# Patient Record
Sex: Female | Born: 1960 | Race: White | Hispanic: No | Marital: Married | Smoking: Former smoker
Health system: Southern US, Community
[De-identification: ages and names within clinical notes are randomized; demographics above are authoritative.]

## PROBLEM LIST (undated history)

## (undated) DIAGNOSIS — M5136 Other intervertebral disc degeneration, lumbar region: Secondary | ICD-10-CM

## (undated) DIAGNOSIS — E7211 Homocystinuria: Secondary | ICD-10-CM

## (undated) DIAGNOSIS — B009 Herpesviral infection, unspecified: Secondary | ICD-10-CM

## (undated) DIAGNOSIS — K219 Gastro-esophageal reflux disease without esophagitis: Secondary | ICD-10-CM

## (undated) DIAGNOSIS — G43909 Migraine, unspecified, not intractable, without status migrainosus: Secondary | ICD-10-CM

## (undated) DIAGNOSIS — I10 Essential (primary) hypertension: Secondary | ICD-10-CM

## (undated) DIAGNOSIS — G459 Transient cerebral ischemic attack, unspecified: Secondary | ICD-10-CM

## (undated) HISTORY — DX: Herpesviral infection, unspecified: B00.9

## (undated) HISTORY — DX: Migraine, unspecified, not intractable, without status migrainosus: G43.909

## (undated) HISTORY — DX: Essential (primary) hypertension: I10

## (undated) HISTORY — PX: TONSILLECTOMY: SUR1361

## (undated) HISTORY — DX: Other intervertebral disc degeneration, lumbar region: M51.36

## (undated) HISTORY — DX: Homocystinuria: E72.11

## (undated) HISTORY — DX: Transient cerebral ischemic attack, unspecified: G45.9

## (undated) HISTORY — PX: OTHER SURGICAL HISTORY: SHX169

## (undated) HISTORY — PX: ABDOMINAL HYSTERECTOMY: SHX81

---

## 1999-04-04 ENCOUNTER — Other Ambulatory Visit: Admission: RE | Admit: 1999-04-04 | Discharge: 1999-04-04 | Payer: Self-pay | Admitting: Obstetrics and Gynecology

## 2000-04-30 ENCOUNTER — Other Ambulatory Visit: Admission: RE | Admit: 2000-04-30 | Discharge: 2000-04-30 | Payer: Self-pay | Admitting: Obstetrics and Gynecology

## 2001-05-06 ENCOUNTER — Other Ambulatory Visit: Admission: RE | Admit: 2001-05-06 | Discharge: 2001-05-06 | Payer: Self-pay | Admitting: Obstetrics and Gynecology

## 2001-07-21 ENCOUNTER — Encounter: Payer: Self-pay | Admitting: Obstetrics and Gynecology

## 2001-07-21 ENCOUNTER — Encounter: Admission: RE | Admit: 2001-07-21 | Discharge: 2001-07-21 | Payer: Self-pay | Admitting: Obstetrics and Gynecology

## 2002-05-06 ENCOUNTER — Encounter: Payer: Self-pay | Admitting: Emergency Medicine

## 2002-05-06 ENCOUNTER — Emergency Department (HOSPITAL_COMMUNITY): Admission: EM | Admit: 2002-05-06 | Discharge: 2002-05-06 | Payer: Self-pay | Admitting: Emergency Medicine

## 2002-05-06 ENCOUNTER — Inpatient Hospital Stay (HOSPITAL_COMMUNITY): Admission: EM | Admit: 2002-05-06 | Discharge: 2002-05-08 | Payer: Self-pay | Admitting: Neurology

## 2002-05-07 ENCOUNTER — Encounter: Payer: Self-pay | Admitting: Neurology

## 2002-05-07 ENCOUNTER — Encounter (INDEPENDENT_AMBULATORY_CARE_PROVIDER_SITE_OTHER): Payer: Self-pay | Admitting: Cardiology

## 2002-05-08 DIAGNOSIS — G459 Transient cerebral ischemic attack, unspecified: Secondary | ICD-10-CM

## 2002-05-08 HISTORY — DX: Transient cerebral ischemic attack, unspecified: G45.9

## 2003-01-15 ENCOUNTER — Encounter: Payer: Self-pay | Admitting: Obstetrics and Gynecology

## 2003-01-15 ENCOUNTER — Encounter: Admission: RE | Admit: 2003-01-15 | Discharge: 2003-01-15 | Payer: Self-pay | Admitting: Obstetrics and Gynecology

## 2003-11-02 ENCOUNTER — Encounter: Admission: RE | Admit: 2003-11-02 | Discharge: 2003-11-02 | Payer: Self-pay | Admitting: Family Medicine

## 2005-08-03 ENCOUNTER — Encounter: Admission: RE | Admit: 2005-08-03 | Discharge: 2005-08-03 | Payer: Self-pay | Admitting: Obstetrics & Gynecology

## 2006-03-04 ENCOUNTER — Encounter: Admission: RE | Admit: 2006-03-04 | Discharge: 2006-03-04 | Payer: Self-pay | Admitting: Family Medicine

## 2007-07-12 ENCOUNTER — Emergency Department (HOSPITAL_COMMUNITY): Admission: EM | Admit: 2007-07-12 | Discharge: 2007-07-12 | Payer: Self-pay | Admitting: Emergency Medicine

## 2007-09-25 ENCOUNTER — Encounter: Admission: RE | Admit: 2007-09-25 | Discharge: 2007-09-25 | Payer: Self-pay | Admitting: Family Medicine

## 2007-09-29 ENCOUNTER — Ambulatory Visit: Payer: Self-pay | Admitting: Vascular Surgery

## 2007-10-30 ENCOUNTER — Ambulatory Visit: Payer: Self-pay | Admitting: Vascular Surgery

## 2007-11-13 ENCOUNTER — Ambulatory Visit: Payer: Self-pay | Admitting: Vascular Surgery

## 2009-02-22 ENCOUNTER — Encounter: Admission: RE | Admit: 2009-02-22 | Discharge: 2009-02-22 | Payer: Self-pay | Admitting: Obstetrics & Gynecology

## 2009-02-25 ENCOUNTER — Encounter: Admission: RE | Admit: 2009-02-25 | Discharge: 2009-02-25 | Payer: Self-pay | Admitting: Obstetrics & Gynecology

## 2010-07-31 ENCOUNTER — Encounter: Payer: Self-pay | Admitting: Obstetrics & Gynecology

## 2010-09-06 ENCOUNTER — Other Ambulatory Visit: Payer: Self-pay | Admitting: Obstetrics & Gynecology

## 2010-09-06 DIAGNOSIS — Z1231 Encounter for screening mammogram for malignant neoplasm of breast: Secondary | ICD-10-CM

## 2010-09-13 ENCOUNTER — Ambulatory Visit
Admission: RE | Admit: 2010-09-13 | Discharge: 2010-09-13 | Disposition: A | Discharge status: Home / Self Care | Payer: BC Managed Care – PPO | Source: Ambulatory Visit | Attending: Obstetrics & Gynecology | Admitting: Obstetrics & Gynecology

## 2010-09-13 DIAGNOSIS — Z1231 Encounter for screening mammogram for malignant neoplasm of breast: Secondary | ICD-10-CM

## 2010-09-15 ENCOUNTER — Other Ambulatory Visit: Payer: Self-pay | Admitting: Obstetrics & Gynecology

## 2010-09-15 DIAGNOSIS — R928 Other abnormal and inconclusive findings on diagnostic imaging of breast: Secondary | ICD-10-CM

## 2010-09-20 ENCOUNTER — Ambulatory Visit
Admission: RE | Admit: 2010-09-20 | Discharge: 2010-09-20 | Disposition: A | Discharge status: Home / Self Care | Payer: BC Managed Care – PPO | Source: Ambulatory Visit | Attending: Obstetrics & Gynecology | Admitting: Obstetrics & Gynecology

## 2010-09-20 DIAGNOSIS — R928 Other abnormal and inconclusive findings on diagnostic imaging of breast: Secondary | ICD-10-CM

## 2010-09-25 ENCOUNTER — Other Ambulatory Visit: Payer: BC Managed Care – PPO

## 2010-11-21 NOTE — Consult Note (Signed)
VASCULAR SURGERY CONSULTATION   Autumn Coleman, Autumn Coleman  DOB:  1961-06-15                                       09/29/2007  EAVWU#:[id number]   The patient is a 50 year old healthy female who is referred through the  courtesy of Dr. Clarene Duke for evaluation of venous insufficiency.  She has  noted permanent spider veins and reticular veins in her lower thigh and  particularly posteriorly in the distal thigh and proximal calf over the  last year.  She does not complain of pain or discomfort associated with  these, but they have become more prominent in appearance.  She denies  any history of deep vein thrombosis, thrombophlebitis, pulmonary emboli,  distal swelling, venous stasis ulcers, or bleeding associated with her  venous disease.  She has not worn elastic compression stockings and does  not require pain medication or elevation of the legs.   PAST MEDICAL HISTORY:  Negative for diabetes, hypertension, coronary  artery disease, chronic obstructive pulmonary disease, or stroke.  She  does have gastroesophageal reflux disease, which is mild.   PREVIOUS SURGERIES:  1. Hysterectomy.  2. Tonsillectomy.   FAMILY HISTORY:  Negative for coronary artery disease, diabetes, and  stroke.   SOCIAL HISTORY:  She is married and has 1 child.  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  She smokes about  1/2 pack of cigarettes per day, but discontinued this about 4 weeks ago.  Has done so for 25 years.  Does not use alcohol.   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  Vision returned within 24 hours.  She has occasional headaches.   ALLERGIES:  Aspirin, ibuprofen, which cause anaphylactic reactions, and  sulfa.   PHYSICAL EXAM:  Blood pressure is 136/88, heart rate 74, respirations  14.  In general, she is a healthy-appearing middle-aged female  in no  apparent distress.  She is alert and oriented x3.  Neck is supple with  3+ carotid pulses palpable.  No bruits audible.  No palpable adenopathy  in the neck.  Her neurologic exam is normal.  Upper extremity pulses 3+  bilaterally.  Chest is clear to auscultation.  Cardiovascular exam is  regular rhythm without murmurs.  Abdomen is soft and nontender with no  palpable masses.  3+ femoral, popliteal, and dorsalis pedis pulses  palpable.  Lower extremity exam reveals no edema or evidence of  ischemia.  No prominent varicosities are noted.  She does have some  prominent reticular veins and spider veins, particularly in the lower  thigh and proximal calf, around the popliteal fossa bilaterally, in the  posterior area.  There are also some spider veins in the distal thigh  medially, particularly on the left.  No hyperpigmentation or ulcerations  noted.   She has primary telangiectasias and reticular veins in the legs, which  are not causing significant symptoms, but are cosmetic concern to her.  We will discuss possible sclerotherapy for these lesions with her, and  if she would like to proceed, we will schedule that in the near future.   Quita Skye Hart Rochester, M.D.  Electronically Signed  JDL/MEDQ  D:  09/29/2007  T:  09/29/2007  Job:  [id number]  cc:   Caryn Bee L. Little, M.D.

## 2010-11-24 NOTE — Discharge Summary (Signed)
NAME:  Autumn, Coleman                           ACCOUNT NO.:  [id number]   MEDICAL RECORD NO.:  [id number]                   PATIENT TYPE:  INP   LOCATION:  3030                                 FACILITY:  [facility]   PHYSICIAN:  Genene Churn. Love, M.D.                 DATE OF BIRTH:  June 29, 1961   DATE OF ADMISSION:  05/06/2002  DATE OF DISCHARGE:  05/08/2002                                 DISCHARGE SUMMARY   DISCHARGE DIAGNOSES:  1. Left visual field loss secondary to migraine.  2. Hyperhomocystinemia.  3. Tobacco abuse.  4. History of migraines.   DISCHARGE MEDICATIONS:  1. Foltx one q.d.  2. Nicotine patch 14 mg q.d. x 1 month and then 7 mg q.d. x 1 month.   STUDIES PERFORMED:  1. CT of the head which was normal with no acute abnormalities.  2. MRA of the brain showing no acute infarction.  3. MRA of the head showing no occlusion of large or medium sized vessels.     Question stenosis at the base of the left carotid.  4. A 2-D echocardiogram normal.  5. EKG which is normal sinus rhythm.   LABORATORY DATA:  Homocysteine elevated at 15.18.  The fasting lipid panel  looked good with a cholesterol of 122, triglycerides 32, HDL 47, and LDL 69.  Coagulation studies were normal.  Chemistries and CBC were normal, except  for an elevated hemoglobin at 16.3 and hematocrit 47.5.   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 the day of  admission when she noted a sudden onset of loss of vision in her left visual  field after eating dinner.  The symptoms lasted about 15 minutes and  resolved.  She felt as though she was seeing waves vertically in the center  of her visual field without eyes opened and eyes closed.  Initially the  symptoms cleared, but they subsequently recurred and have fluctuated four or  five times over  the course of the past few hours.  She denied any other  focal neurological deficit.  She does report having a mild frontal headache  without nausea.  In the past, she has had visual auras of spots with her  migraines, but has never lost her visual fields in this way.  She was  brought to the emergency room for further evaluation.  She was transported  to [facility] for further neurological work-up.   HOSPITAL COURSE:  Work-up was negative for acute stroke.  No source of  emboli found.  The final diagnosis is that of migraine even though headache  was not significantly present at onset.  Do recommend migraine prophylaxis  if frequency of headaches increase.  Also, homocysteine was found  to be  elevated, which is a stroke and heart disease risk factor.  Will lower that  with Foltx.  The patient was counseled to stop smoking.   CONDITION ON DISCHARGE:  The patient is alert and oriented x 3.  Speech  clear.  No aphasia.  Visual fields are full.  Extraocular movements are  intact.  Strength is normal.  Sensation is normal.  The chest is clear to  auscultation.  The heart rate is regular.   DISCHARGE MEDICATIONS:  Foltx to decrease homocysteine level.   SPECIAL INSTRUCTIONS:  Stop smoking.  Prophylaxis for migraines if frequency  increases.  Watch tyramine in diet.   FOLLOW-UP:  Follow up with Caryn Bee L. Little, M.D., as needed.  No need for  neurological follow-up.     Autumn Coleman, N.P.                         Genene Churn. Sandria Manly, M.D.    SB/MEDQ  D:  05/08/2002  T:  05/09/2002  Job:  [id number]   cc:   Caryn Bee L. Little, M.D.  [address]  [place]  [place] [address]  Fax: [phone]

## 2010-11-24 NOTE — H&P (Signed)
NAME:  Autumn Coleman, HOUSLEY                           ACCOUNT NO.:  [id number]   MEDICAL RECORD NO.:  [id number]                   PATIENT TYPE:  INP   LOCATION:  3030                                 FACILITY:  [facility]   PHYSICIAN:  Casimiro Needle L. Thad Ranger, M.D.           DATE OF BIRTH:  [birth year]   DATE OF ADMISSION:  05/06/2002  DATE OF DISCHARGE:                                HISTORY & PHYSICAL   CHIEF COMPLAINT:  Visual disturbance.   HISTORY OF PRESENT ILLNESS:  This is a first at [facility] hospital  admission for this 50 year old woman with a past medical history which  includes migraine who was in her usual state of excellent health until about  7:00 this evening when she was sitting at dinner and noticed a relative  sudden loss of her left visual fields. She noted that she could perceive  light out of the visual fields but could not identify a person sitting to  the left of her at dinner. Symptoms lasted about 15 minutes and resolved,  and as they resolved, she noted seeing through waves. Initially, her  symptoms completely cleared; however, they subsequently recurred and have  fluctuated about four or five times over the course of her trip to and stay  in the emergency room. There is no associated speech problem or weakness,  numbness, or paresthesias in the extremities. She does report having a mild  frontal headache without associated nausea. The headaches that she has right  now is very mild and not typical of her usual migraine headaches. In the  past, she has had visual auras of spots with her migraines but has never  lost her visual fields in this fashion. She was brought to the emergency  room for further evaluation, and CT of the head was negative, and  neurological consultation was subsequently requested.   PAST MEDICAL HISTORY:  Past medical history is remarkable for migraine as  above. She knows that this has been much improved since hysterectomy in  1995. She has had  two or three mild headaches since that time. She denies  any history of hypertension, diabetes, high cholesterol, or heart disease.   FAMILY HISTORY:  Remarkable for a grandmother with migraines and a stroke at  age 3. She also has parents and siblings with hypertension, diabetes.   SOCIAL HISTORY:  She works as a Engineer, civil (consulting) in the [employer]. She  is independent in activities of daily living. She smokes a pack of  cigarettes a day and does occasionally use alcohol.   ALLERGIES:  She reports an anaphylactic reaction to aspirin and ibuprofen.  She also is allergic to sulfa and shellfish.   MEDICATIONS:  None regularly. She does take Tylenol or Aleve as needed for  headaches.   REVIEW OF SYMPTOMS:  HEAD:  Headache as above. EYES:  Visual symptoms as  above. CONSTITUTIONAL:  No fevers, chills. ENT:  No dizziness. RESPIRATORY:  No cough or shortness of breath. CARDIOVASCULAR:  No chest pain or  palpitations. GASTROINTESTINAL:  No nausea, vomiting, or diarrhea.  GENITOURINARY:  No dysuria or other urinary symptoms. SKIN:  No rash.  RHEUMATOLOGIC:  No joint pain.   PHYSICAL EXAMINATION:  VITAL SIGNS:  Temperature 98.2, blood pressure  139/85, pulse 95, respirations 20.  GENERAL/MENTAL STATUS:  She is awake, alert in no evident distress. Speech  is normal and not dysarthric. Mood and affect appropriate.  HEAD:  Atraumatic and normocephalic. Oropharynx is benign.  NECK:  Supple without carotid bruits.  CHEST:  Clear to auscultation.  HEART:  Regular rhythm without murmurs.  EXTREMITIES:  No edema, 2+ pulses.  NEUROLOGIC EXAM:  Mental status as above. Cranial nerves and funduscopic  exam is benign. Pupils are equal and briskly reactive. Extraocular movements  are normal without nystagmus. Visual fields are full to confrontation. Face,  tongue and palate all move normally and symmetrically. Facial sensation is  intact to pinprick. Motor:  Normal bulk and tone. Normal strength,  no ______  muscle, sensation is intact to pinprick and light touch in all extremities.  Reflexes are a little brisk throughout. Toes are downgoing. Finger-to-nose  and heel-to-shin are performed well. Rapid alternating movements are  performed well, and gait exam is deferred.   LABORATORY DATA:  CBC is remarkable for elevated hemoglobin of 16.3, normal  WBCs and platelets. BUN is unremarkable. Total protein is elevated at 8.7.  LFTs are otherwise normal. Prothrombin time is slightly low at 12.0, PTT is  normal at 26. CT of the head is personally reviewed and demonstrates no  acute changes.   IMPRESSION:  Fluctuating left visual field loss. Most like this represents a  migraine event; however, cannot exclude the possibility of a transient  ischemic attack due to fluctuating ischemia in the right posterior cerebral  artery territory.   PLAN:  Will admit. Will start therapy with intravenous heparin. Will check  MRI, 2-D echocardiogram, and return stroke labs.   DISPOSITION:  Pending outcome of above.                                               Michael L. Thad Ranger, M.D.    MLR/MEDQ  D:  05/06/2002  T:  05/07/2002  Job:  [id number]

## 2012-05-18 ENCOUNTER — Emergency Department (HOSPITAL_BASED_OUTPATIENT_CLINIC_OR_DEPARTMENT_OTHER): Payer: BC Managed Care – PPO

## 2012-05-18 ENCOUNTER — Emergency Department (HOSPITAL_BASED_OUTPATIENT_CLINIC_OR_DEPARTMENT_OTHER)
Admission: EM | Admit: 2012-05-18 | Discharge: 2012-05-18 | Disposition: A | Discharge status: Home / Self Care | Payer: BC Managed Care – PPO | Attending: Emergency Medicine | Admitting: Emergency Medicine

## 2012-05-18 ENCOUNTER — Encounter (HOSPITAL_BASED_OUTPATIENT_CLINIC_OR_DEPARTMENT_OTHER): Payer: Self-pay | Admitting: *Deleted

## 2012-05-18 DIAGNOSIS — S8010XA Contusion of unspecified lower leg, initial encounter: Secondary | ICD-10-CM | POA: Insufficient documentation

## 2012-05-18 DIAGNOSIS — K219 Gastro-esophageal reflux disease without esophagitis: Secondary | ICD-10-CM | POA: Insufficient documentation

## 2012-05-18 DIAGNOSIS — S8011XA Contusion of right lower leg, initial encounter: Secondary | ICD-10-CM

## 2012-05-18 DIAGNOSIS — Y939 Activity, unspecified: Secondary | ICD-10-CM | POA: Insufficient documentation

## 2012-05-18 DIAGNOSIS — Z79899 Other long term (current) drug therapy: Secondary | ICD-10-CM | POA: Insufficient documentation

## 2012-05-18 DIAGNOSIS — Y92009 Unspecified place in unspecified non-institutional (private) residence as the place of occurrence of the external cause: Secondary | ICD-10-CM | POA: Insufficient documentation

## 2012-05-18 DIAGNOSIS — W1789XA Other fall from one level to another, initial encounter: Secondary | ICD-10-CM | POA: Insufficient documentation

## 2012-05-18 DIAGNOSIS — F17200 Nicotine dependence, unspecified, uncomplicated: Secondary | ICD-10-CM | POA: Insufficient documentation

## 2012-05-18 HISTORY — DX: Gastro-esophageal reflux disease without esophagitis: K21.9

## 2012-05-18 MED ORDER — HYDROCODONE-ACETAMINOPHEN 5-325 MG PO TABS: 1.0000 | ORAL_TABLET | Freq: Once | ORAL | Status: DC

## 2012-05-18 MED ORDER — HYDROCODONE-ACETAMINOPHEN 5-325 MG PO TABS: 1.0000 | ORAL_TABLET | ORAL | Status: DC | PRN

## 2012-05-18 NOTE — ED Provider Notes (Signed)
History     CSN: [id number]  Arrival date & time 05/18/12  7846   First MD Initiated Contact with Patient 05/18/12 [phone]      Chief Complaint  Patient presents with  . Leg Injury    (Consider location/radiation/quality/duration/timing/severity/associated sxs/prior treatment) HPI Pt states she fell through ceiling while up in attic this AM. She is c/o R lateral distal femur pain and swelling. Pain with flexion at knee. Abrasion and contusion noted at the site of swelling. No pain with knee extension. No head or neck injury  Past Medical History  Diagnosis Date  . GERD (gastroesophageal reflux disease)     Past Surgical History  Procedure Date  . Abdominal hysterectomy     No family history on file.  History  Substance Use Topics  . Smoking status: Current Every Day Smoker -- 0.5 packs/day    Types: Cigarettes  . Smokeless tobacco: Not on file  . Alcohol Use: No    OB History    Grav Para Term Preterm Abortions TAB SAB Ect Mult Living                  Review of Systems  HENT: Negative for neck pain.   Musculoskeletal: Positive for myalgias.  Skin: Positive for wound.  Neurological: Negative for syncope, weakness, numbness and headaches.    Allergies  Asa and Ibuprofen  Home Medications   Current Outpatient Rx  Name  Route  Sig  Dispense  Refill  . PANTOPRAZOLE SODIUM 20 MG PO TBEC   Oral   Take 20 mg by mouth daily.         . SERTRALINE HCL 25 MG PO TABS   Oral   Take 25 mg by mouth daily.         Marland Kitchen HYDROCODONE-ACETAMINOPHEN 5-325 MG PO TABS   Oral   Take 1 tablet by mouth every 4 (four) hours as needed for pain.   10 tablet   0     There were no vitals taken for this visit.  Physical Exam  Nursing note and vitals reviewed. Constitutional: She is oriented to person, place, and time. She appears well-developed and well-nourished. No distress.  HENT:  Head: Normocephalic and atraumatic.  Mouth/Throat: Oropharynx is clear and moist.    Eyes: EOM are normal. Pupils are equal, round, and reactive to light.  Neck: Normal range of motion. Neck supple.       No posterior cervical TTP  Cardiovascular: Normal rate and regular rhythm.   Pulmonary/Chest: Effort normal and breath sounds normal. No respiratory distress. She has no wheezes. She has no rales.  Abdominal: Soft. Bowel sounds are normal. She exhibits no mass. There is no tenderness. There is no rebound and no guarding.  Musculoskeletal: Normal range of motion. She exhibits tenderness (TTP over distal R vastus lateralis with noted abrasion and soft tissue swelling. Swelling does not change postiions with flexion and extension of knee. ). She exhibits no edema.       Knee with FROM, no ligamentous laxity. 2+ DP pulses.   Neurological: She is alert and oriented to person, place, and time.       5/5 motor in all ext. Sensation intact  Skin: Skin is warm and dry. No rash noted. No erythema.  Psychiatric: She has a normal mood and affect. Her behavior is normal.    ED Course  Procedures (including critical care time)  Labs Reviewed - No data to display Dg Femur Right  05/18/2012  *  RADIOLOGY REPORT*  Clinical Data: Slipped and fell through this sealing.  Abrasion of the distal anterior femur.  RIGHT FEMUR - 2 VIEW  Comparison: None.  Findings: There is no evidence for acute fracture or dislocation. No soft tissue foreign body or gas identified.  IMPRESSION: Negative exam.   Original Report Authenticated By: Norva Pavlov, M.D.      1. Contusion of right leg       MDM  Given exam suspect swelling of leg is hematoma though cannot exclude muscle or ligamentous injury. I have advised pt to keep leg elevated, apply ice. If swelling and pain persists she will need to f/u with an orthopedist. She voiced understanding and says she has seen Dr Jillyn Hidden in the past and will f/u with him.         Loren Racer, MD 05/18/12 1109

## 2012-05-18 NOTE — ED Notes (Signed)
Pt presents to ED today with possible fracture to right upper leg.  Pt fell through plywood ceiling.  Pt has + deformity to RU leg with weak distal pulses

## 2012-05-18 NOTE — ED Notes (Addendum)
Pt at nurses station extremely upset about "level of care"  Pt has been ambulatory to bathroom.  Pt stated "I know you have not been busy this whole time and my pain medicine was ordered 15 minutes ago"  I attempted to explain and pt did not "want to hear anything"  Spoke with Dr Ranae Palms who dischaged pt.  Pt states "I'm not staying any longer than 5 minutes." I offered pain medication and pt refused stating "I'm not staying"

## 2013-03-13 ENCOUNTER — Encounter (HOSPITAL_BASED_OUTPATIENT_CLINIC_OR_DEPARTMENT_OTHER): Payer: Self-pay | Admitting: *Deleted

## 2013-03-13 ENCOUNTER — Emergency Department (HOSPITAL_BASED_OUTPATIENT_CLINIC_OR_DEPARTMENT_OTHER)
Admission: EM | Admit: 2013-03-13 | Discharge: 2013-03-13 | Disposition: A | Discharge status: Home / Self Care | Payer: BC Managed Care – PPO | Attending: Emergency Medicine | Admitting: Emergency Medicine

## 2013-03-13 ENCOUNTER — Emergency Department (HOSPITAL_BASED_OUTPATIENT_CLINIC_OR_DEPARTMENT_OTHER): Payer: BC Managed Care – PPO

## 2013-03-13 DIAGNOSIS — S8990XA Unspecified injury of unspecified lower leg, initial encounter: Secondary | ICD-10-CM | POA: Insufficient documentation

## 2013-03-13 DIAGNOSIS — Z9889 Other specified postprocedural states: Secondary | ICD-10-CM | POA: Insufficient documentation

## 2013-03-13 DIAGNOSIS — S99922A Unspecified injury of left foot, initial encounter: Secondary | ICD-10-CM

## 2013-03-13 DIAGNOSIS — Z79899 Other long term (current) drug therapy: Secondary | ICD-10-CM | POA: Insufficient documentation

## 2013-03-13 DIAGNOSIS — Y9269 Other specified industrial and construction area as the place of occurrence of the external cause: Secondary | ICD-10-CM | POA: Insufficient documentation

## 2013-03-13 DIAGNOSIS — W2209XA Striking against other stationary object, initial encounter: Secondary | ICD-10-CM | POA: Insufficient documentation

## 2013-03-13 DIAGNOSIS — Y9389 Activity, other specified: Secondary | ICD-10-CM | POA: Insufficient documentation

## 2013-03-13 DIAGNOSIS — F17200 Nicotine dependence, unspecified, uncomplicated: Secondary | ICD-10-CM | POA: Insufficient documentation

## 2013-03-13 DIAGNOSIS — K219 Gastro-esophageal reflux disease without esophagitis: Secondary | ICD-10-CM | POA: Insufficient documentation

## 2013-03-13 NOTE — ED Notes (Signed)
Pt c/o left great toe/ nail injury x 30 mins ago

## 2013-03-13 NOTE — ED Provider Notes (Signed)
CSN: [id number]     Arrival date & time 03/13/13  2052 History   First MD Initiated Contact with Patient 03/13/13 2103     Chief Complaint  Patient presents with  . Toe Injury   (Consider location/radiation/quality/duration/timing/severity/associated sxs/prior Treatment) The history is provided by the patient and medical records. No language interpreter was used.    Autumn Coleman is a 52 y.o. female  with a hx of GERD presents to the Emergency Department complaining of acute, persistent pain to the left great toe approx 2 hours PTA.  Pt reports her foot slipped on the garage floor and her left great toe hit the brick wall, damaging the toe. Pt reports pain to the toe. Hx of surgery to the great toe and long toe of the left foot many years ago.  Associated symptoms include broken toenail.  Nothing makes it better and nothing makes it worse.  Pt denies fever, chills, headache, neck pain, chest pain, SOB, abd pain, N/V/D, weakness, numbness, tingling.  Pt denies falling, hitting her head, neck/back pain.     Past Medical History  Diagnosis Date  . GERD (gastroesophageal reflux disease)    Past Surgical History  Procedure Laterality Date  . Abdominal hysterectomy     History reviewed. No pertinent family history. History  Substance Use Topics  . Smoking status: Current Every Day Smoker -- 0.50 packs/day    Types: Cigarettes  . Smokeless tobacco: Not on file  . Alcohol Use: No   OB History   Grav Para Term Preterm Abortions TAB SAB Ect Mult Living                 Review of Systems  Constitutional: Negative for fever, diaphoresis, appetite change, fatigue and unexpected weight change.  HENT: Negative for mouth sores and neck stiffness.   Eyes: Negative for visual disturbance.  Respiratory: Negative for cough, chest tightness, shortness of breath and wheezing.   Cardiovascular: Negative for chest pain.  Gastrointestinal: Negative for nausea, vomiting, abdominal pain, diarrhea and  constipation.  Musculoskeletal: Positive for arthralgias. Negative for back pain.  Skin: Positive for wound. Negative for rash.  Allergic/Immunologic: Negative for immunocompromised state.  Neurological: Negative for syncope, light-headedness and headaches.  Hematological: Does not bruise/bleed easily.  Psychiatric/Behavioral: Negative for sleep disturbance. The patient is not nervous/anxious.     Allergies  Asa and Ibuprofen  Home Medications   Current Outpatient Rx  Name  Route  Sig  Dispense  Refill  . HYDROcodone-acetaminophen (NORCO/VICODIN) 5-325 MG per tablet   Oral   Take 1 tablet by mouth every 4 (four) hours as needed for pain.   10 tablet   0   . pantoprazole (PROTONIX) 20 MG tablet   Oral   Take 20 mg by mouth daily.         . sertraline (ZOLOFT) 25 MG tablet   Oral   Take 25 mg by mouth daily.          BP 168/100  Pulse 81  Temp(Src) 98.1 F (36.7 C) (Oral)  Resp 16  Ht 5\' 10"  (1.778 m)  Wt 172 lb (78.019 kg)  BMI 24.68 kg/m2  SpO2 100% Physical Exam  Nursing note and vitals reviewed. Constitutional: She appears well-developed and well-nourished. No distress.  HENT:  Head: Normocephalic and atraumatic.  Eyes: Conjunctivae are normal.  Neck: Normal range of motion.  Cardiovascular: Normal rate, regular rhythm, normal heart sounds and intact distal pulses.   No murmur heard. Capillary refill <  3 sec  Pulmonary/Chest: Effort normal and breath sounds normal. No respiratory distress. She has no wheezes.  Musculoskeletal: She exhibits tenderness. She exhibits no edema.  ROM: full ROM of all toes of the left foot  Neurological: She is alert. Coordination normal.  Sensation intact Strength 5/5 including resisted flexion and extension  Skin: Skin is warm and dry. No rash noted. She is not diaphoretic. No erythema.  No tenting of the skin Mild ecchymosis of the dorsum of the foot with small abrasion Broken left great toenail  Psychiatric: She has a  normal mood and affect.    ED Course  NAIL REMOVAL Date/Time: 03/13/2013 9:50 PM Performed by: Dierdre Forth Authorized by: Dierdre Forth Consent: Verbal consent obtained. Risks and benefits: risks, benefits and alternatives were discussed Consent given by: patient Patient understanding: patient states understanding of the procedure being performed Patient consent: the patient's understanding of the procedure matches consent given Procedure consent: procedure consent matches procedure scheduled Relevant documents: relevant documents present and verified Site marked: the operative site was marked Imaging studies: imaging studies available Required items: required blood products, implants, devices, and special equipment available Patient identity confirmed: verbally with patient and arm band Time out: Immediately prior to procedure a "time out" was called to verify the correct patient, procedure, equipment, support staff and site/side marked as required. Location: left foot Location details: left big toe Anesthesia: digital block Local anesthetic: lidocaine 2% without epinephrine Anesthetic total: 4 ml Patient sedated: no Preparation: skin prepped with alcohol Amount removed: complete Wedge excision of skin of nail fold: yes Nail bed sutured: no Nail matrix removed: complete Removed nail replaced and anchored: no Dressing: Xeroform gauze Patient tolerance: Patient tolerated the procedure well with no immediate complications. Comments: Nailbed splinted with Xeroform gauze   (including critical care time) Labs Review Labs Reviewed - No data to display Imaging Review Dg Toe Great Left  03/13/2013   *RADIOLOGY REPORT*  Clinical Data: Blunt trauma to the left great toe.  Prior surgery  LEFT GREAT TOE  Comparison: None  Findings: There is a surgical cerclage wire adjacent to the proximal phalanx of the first digit.  No evidence of acute fracture dislocation.  IMPRESSION:  No acute fracture or dislocation.   Original Report Authenticated By: Genevive Bi, M.D.    MDM   1. Injury of great toe, left, initial encounter    Autumn Coleman presents with toe pain and toenail injury.  Patient X-Ray negative for obvious fracture or dislocation. I personally reviewed the imaging tests through PACS system.  I reviewed available ER/hospitalization records through the EMR.  Pt advised to follow up with orthopedics if symptoms persist for possibility of missed fracture diagnosis. Toenail removed in total without difficulty.  Care for toenail removal discussed.  Conservative therapy recommended and discussed. Patient will be dc home & is agreeable with above plan.    Dahlia Client Camdynn Maranto, PA-C 03/13/13 2210

## 2013-03-14 NOTE — ED Provider Notes (Signed)
Medical screening examination/treatment/procedure(s) were performed by non-physician practitioner and as supervising physician I was immediately available for consultation/collaboration.   Celene Kras, MD 03/14/13 1501

## 2013-05-16 ENCOUNTER — Emergency Department (HOSPITAL_BASED_OUTPATIENT_CLINIC_OR_DEPARTMENT_OTHER)
Admission: EM | Admit: 2013-05-16 | Discharge: 2013-05-16 | Disposition: A | Discharge status: Home / Self Care | Payer: BC Managed Care – PPO | Attending: Emergency Medicine | Admitting: Emergency Medicine

## 2013-05-16 ENCOUNTER — Encounter (HOSPITAL_BASED_OUTPATIENT_CLINIC_OR_DEPARTMENT_OTHER): Payer: Self-pay | Admitting: Emergency Medicine

## 2013-05-16 ENCOUNTER — Emergency Department (HOSPITAL_BASED_OUTPATIENT_CLINIC_OR_DEPARTMENT_OTHER): Payer: BC Managed Care – PPO

## 2013-05-16 DIAGNOSIS — Z79899 Other long term (current) drug therapy: Secondary | ICD-10-CM | POA: Insufficient documentation

## 2013-05-16 DIAGNOSIS — K219 Gastro-esophageal reflux disease without esophagitis: Secondary | ICD-10-CM | POA: Insufficient documentation

## 2013-05-16 DIAGNOSIS — Y929 Unspecified place or not applicable: Secondary | ICD-10-CM | POA: Insufficient documentation

## 2013-05-16 DIAGNOSIS — S99911A Unspecified injury of right ankle, initial encounter: Secondary | ICD-10-CM

## 2013-05-16 DIAGNOSIS — S8990XA Unspecified injury of unspecified lower leg, initial encounter: Secondary | ICD-10-CM | POA: Insufficient documentation

## 2013-05-16 DIAGNOSIS — IMO0002 Reserved for concepts with insufficient information to code with codable children: Secondary | ICD-10-CM | POA: Insufficient documentation

## 2013-05-16 DIAGNOSIS — Y9389 Activity, other specified: Secondary | ICD-10-CM | POA: Insufficient documentation

## 2013-05-16 DIAGNOSIS — F17200 Nicotine dependence, unspecified, uncomplicated: Secondary | ICD-10-CM | POA: Insufficient documentation

## 2013-05-16 MED ORDER — HYDROCODONE-ACETAMINOPHEN 5-325 MG PO TABS: 2.0000 | ORAL_TABLET | ORAL | Status: DC | PRN

## 2013-05-16 NOTE — ED Provider Notes (Signed)
Medical screening examination/treatment/procedure(s) were performed by non-physician practitioner and as supervising physician I was immediately available for consultation/collaboration.  EKG Interpretation   None        Zylie Mumaw, MD 05/16/13 2136 

## 2013-05-16 NOTE — ED Provider Notes (Signed)
CSN: [id number]     Arrival date & time 05/16/13  1502 History   First MD Initiated Contact with Patient 05/16/13 1624     Chief Complaint  Patient presents with  . Ankle Pain   (Consider location/radiation/quality/duration/timing/severity/associated sxs/prior Treatment) HPI Comments: Patient is a 52 year old female who presents with a 5 day history of right ankle pain. The pain started when she was moving a gazebo and a piece of material hit her right ankle. Patient reports sudden onset of throbbing, severe pain that is localized to right ankle. Patient reports progressive worsening of pain. Ankle movement and weight bearing activity make the pain worse. Nothing makes the pain better. Patient reports associated swelling. Patient has tried icing and elevating the injury. Patient denies obvious deformity, numbness/tingling, coolness/weakness of extremity, and any other injury.      Past Medical History  Diagnosis Date  . GERD (gastroesophageal reflux disease)    Past Surgical History  Procedure Laterality Date  . Abdominal hysterectomy     No family history on file. History  Substance Use Topics  . Smoking status: Current Every Day Smoker -- 0.50 packs/day    Types: Cigarettes  . Smokeless tobacco: Not on file  . Alcohol Use: No   OB History   Grav Para Term Preterm Abortions TAB SAB Ect Mult Living                 Review of Systems  Musculoskeletal: Positive for arthralgias and joint swelling.  All other systems reviewed and are negative.    Allergies  Asa and Ibuprofen  Home Medications   Current Outpatient Rx  Name  Route  Sig  Dispense  Refill  . HYDROcodone-acetaminophen (NORCO/VICODIN) 5-325 MG per tablet   Oral   Take 1 tablet by mouth every 4 (four) hours as needed for pain.   10 tablet   0   . pantoprazole (PROTONIX) 20 MG tablet   Oral   Take 20 mg by mouth daily.         . sertraline (ZOLOFT) 25 MG tablet   Oral   Take 25 mg by mouth daily.          BP 142/91  Temp(Src) 97.7 F (36.5 C) (Oral)  Resp 20  Ht 5\' 10"  (1.778 m)  Wt 180 lb (81.647 kg)  BMI 25.83 kg/m2  SpO2 99% Physical Exam  Nursing note and vitals reviewed. Constitutional: She is oriented to person, place, and time. She appears well-developed and well-nourished. No distress.  HENT:  Head: Normocephalic and atraumatic.  Eyes: Conjunctivae are normal.  Neck: Normal range of motion.  Cardiovascular: Normal rate and regular rhythm.  Exam reveals no gallop and no friction rub.   No murmur heard. Pulmonary/Chest: Effort normal and breath sounds normal. She has no wheezes. She has no rales. She exhibits no tenderness.  Musculoskeletal:  Right ankle ROM limited due to pain and edema. Lateral malleolar edema and tenderness to palpation. Bruising noted. Abrasion noted to right heel, no open wound.   Neurological: She is alert and oriented to person, place, and time. Coordination normal.  Speech is goal-oriented. Moves limbs without ataxia.   Skin: Skin is warm and dry.  Psychiatric: She has a normal mood and affect. Her behavior is normal.    ED Course  Procedures (including critical care time)  SPLINT APPLICATION Date/Time: 11/14/2012 3:38 PM Authorized by: Emilia Beck Consent: Verbal consent obtained. Risks and benefits: risks, benefits and alternatives were discussed Consent given  by: patient Splint applied by: orthopedic technician Location details: right ankle Splint type: ASO ankle brace Supplies used: ankle brace Post-procedure: The splinted body part was neurovascularly unchanged following the procedure. Patient tolerance: Patient tolerated the procedure well with no immediate complications.     Labs Review Labs Reviewed - No data to display Imaging Review Dg Ankle Complete Right  05/16/2013   CLINICAL DATA:  Trauma to right ankle on Tuesday with diffuse pain  EXAM: RIGHT ANKLE - COMPLETE 3+ VIEW  COMPARISON:  None.  FINDINGS: There is no  evidence of fracture, dislocation, or joint effusion. There is no evidence of arthropathy or other focal bone abnormality. Soft tissues are unremarkable.  IMPRESSION: Negative.   Electronically Signed   By: Esperanza Heir M.D.   On: 05/16/2013 16:03    EKG Interpretation   None       MDM   1. Right ankle injury, initial encounter     4:29 PM Xray unremarkable for acute changes. No neurovascular compromise. Patient will be discharged with Vicodin prescription and instructions to ice and elevate affected ankle. Patient will have ASO. Patient instructed to follow up with PCP in a few days if symptoms do not resolve.     Emilia Beck, PA-C 05/16/13 1635

## 2013-05-16 NOTE — ED Notes (Signed)
Was assisting with moving a gazebo, hit in the right lateral ankle by a board.  Abrasion, swelling, worsening pain.  Did not initially seek medical treatment, has been using ice and elevating the extremity at home.

## 2013-08-04 ENCOUNTER — Encounter: Payer: Self-pay | Admitting: Obstetrics & Gynecology

## 2013-08-06 ENCOUNTER — Encounter: Payer: Self-pay | Admitting: Obstetrics & Gynecology

## 2013-08-06 ENCOUNTER — Ambulatory Visit (INDEPENDENT_AMBULATORY_CARE_PROVIDER_SITE_OTHER): Payer: BC Managed Care – PPO | Admitting: Obstetrics & Gynecology

## 2013-08-06 VITALS — BP 131/91 | HR 72 | Resp 18 | Ht 70.5 in | Wt 187.0 lb

## 2013-08-06 DIAGNOSIS — Z01419 Encounter for gynecological examination (general) (routine) without abnormal findings: Secondary | ICD-10-CM

## 2013-08-06 MED ORDER — HYDROCODONE-ACETAMINOPHEN 5-325 MG PO TABS: 1.0000 | ORAL_TABLET | ORAL | Status: AC | PRN

## 2013-08-06 MED ORDER — CYCLOBENZAPRINE HCL 10 MG PO TABS: 10.0000 mg | ORAL_TABLET | Freq: Three times a day (TID) | ORAL | Status: DC | PRN

## 2013-08-06 MED ORDER — VALACYCLOVIR HCL 1 G PO TABS: 1000.0000 mg | ORAL_TABLET | ORAL | Status: DC | PRN

## 2013-08-06 MED ORDER — SERTRALINE HCL 25 MG PO TABS: 75.0000 mg | ORAL_TABLET | Freq: Every day | ORAL | Status: DC
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
## 2013-08-06 NOTE — Patient Instructions (Signed)

## 2013-10-15 ENCOUNTER — Encounter: Payer: Self-pay | Admitting: *Deleted

## 2014-05-10 ENCOUNTER — Encounter: Payer: Self-pay | Admitting: *Deleted

## 2014-08-17 IMAGING — CR DG FEMUR 2+V*R*
4 series · 4 of 4 positions shown · non-contrast
Comparison: None.

CLINICAL DATA: Slipped and fell through this sealing.  Abrasion of
the distal anterior femur.

RIGHT FEMUR - 2 VIEW

[t femur with hip  ap right]
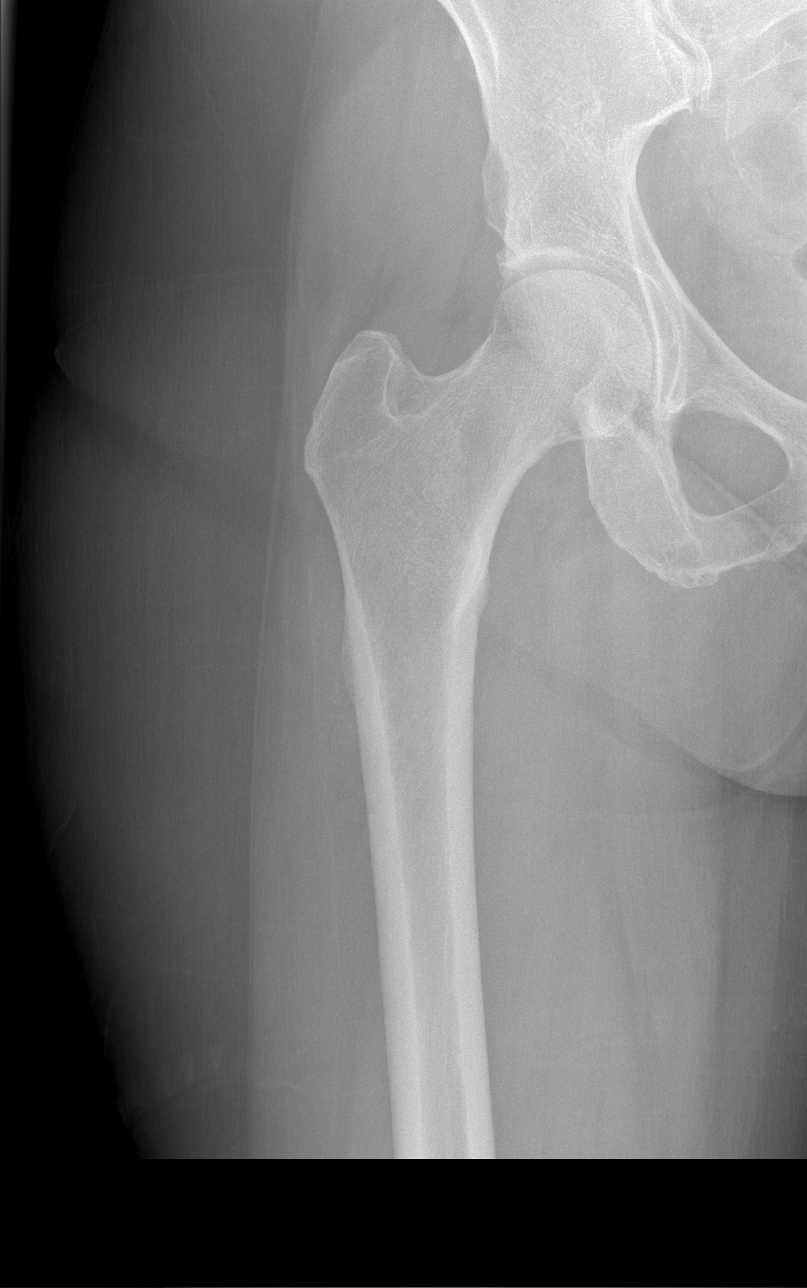

[t femur with knee ap right]
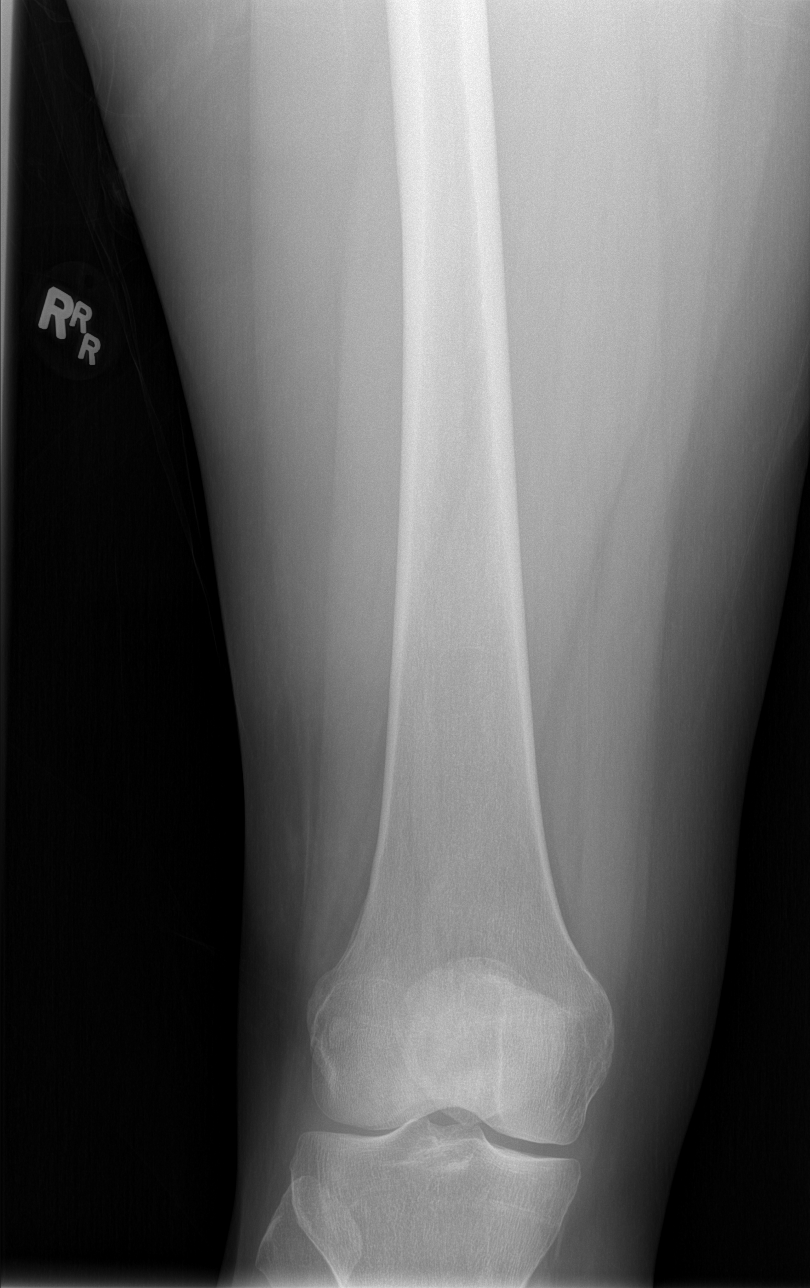

[t femur with hip lat right]
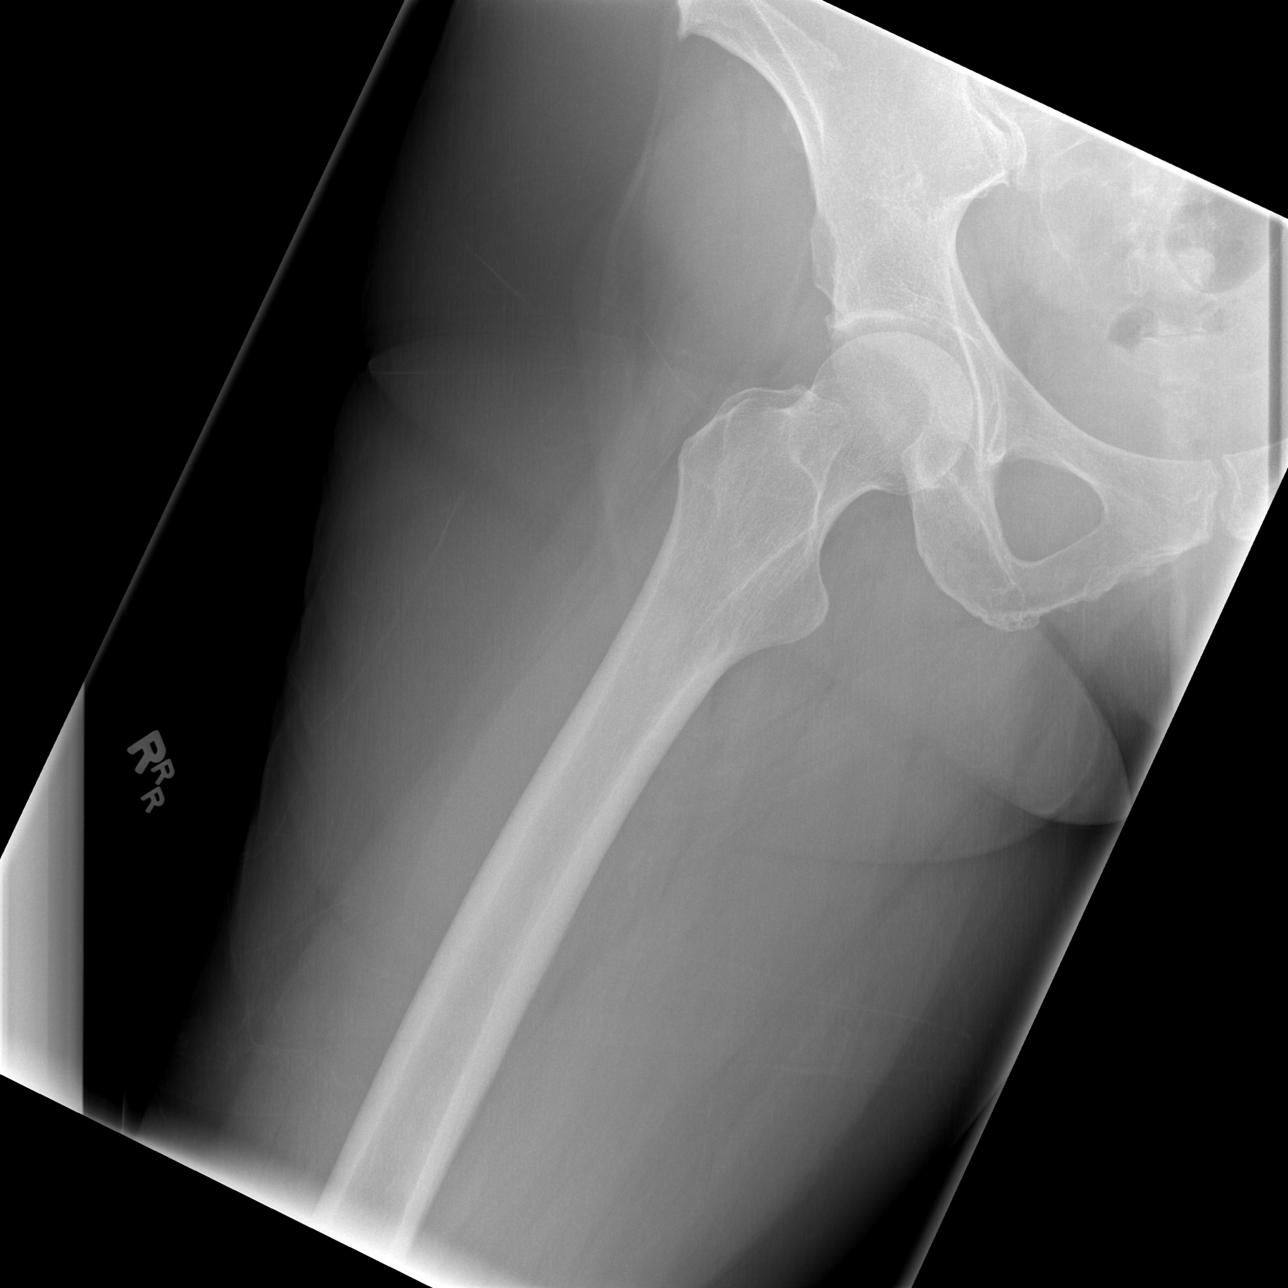

[t femur with knee lat right]
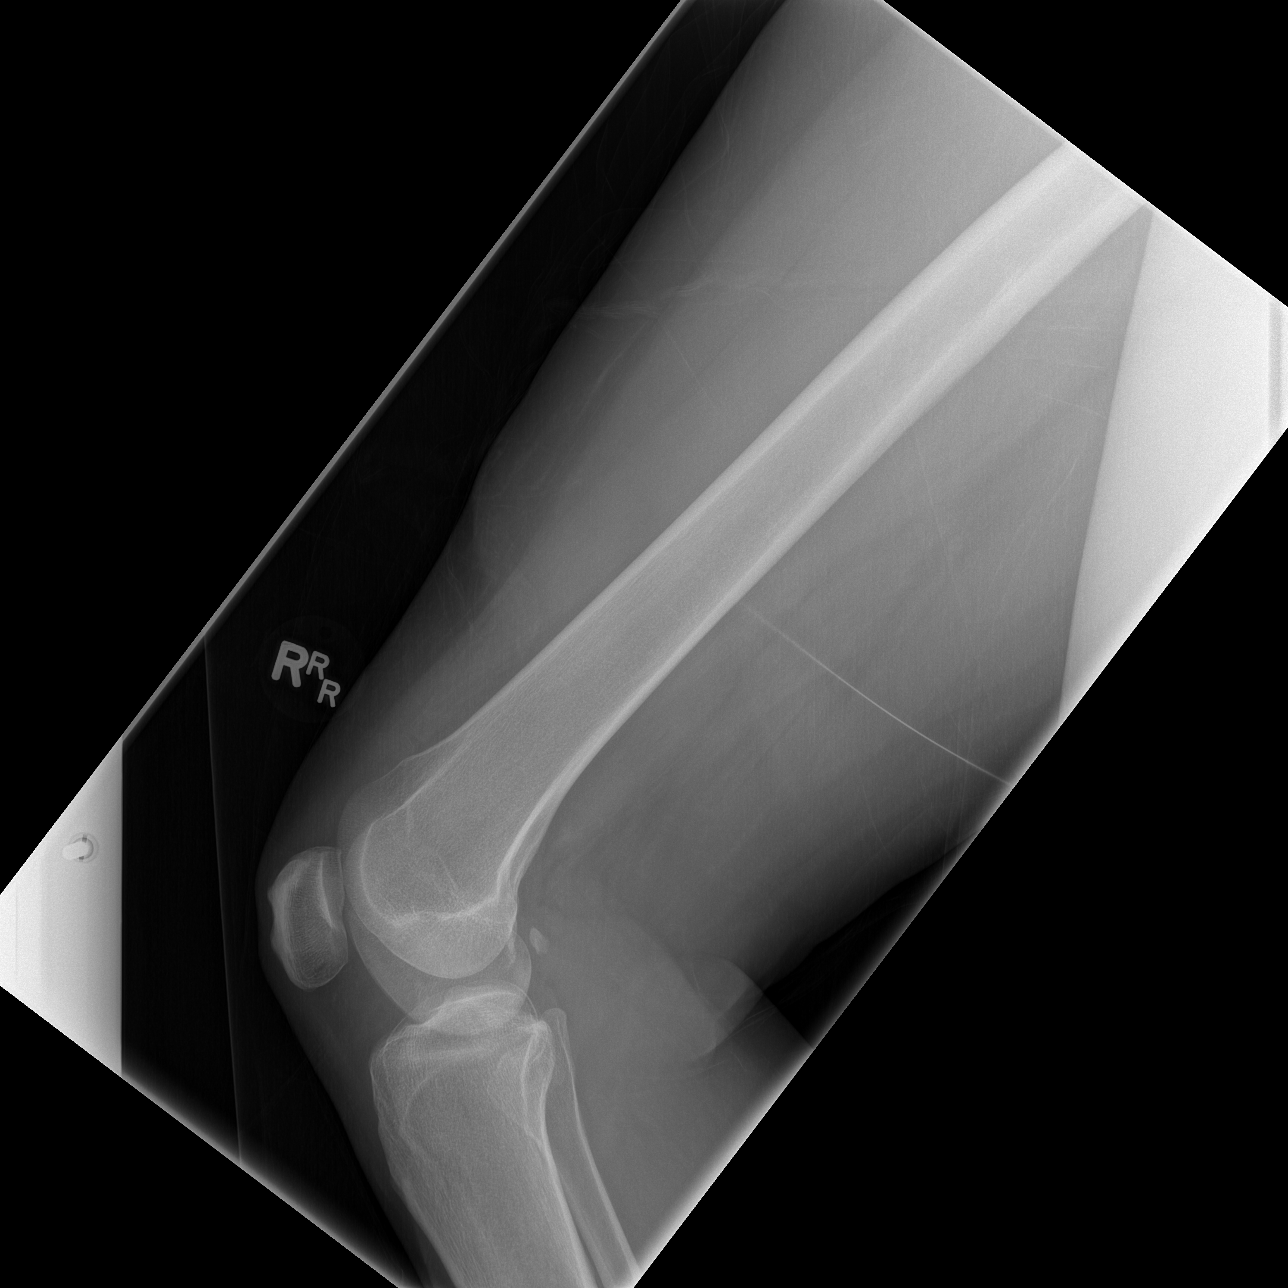

[4 of 4 positions shown; findings below may reference images not displayed]

FINDINGS: There is no evidence for acute fracture or dislocation.
No soft tissue foreign body or gas identified.
IMPRESSION: Negative exam.

## 2014-08-19 ENCOUNTER — Ambulatory Visit: Payer: BC Managed Care – PPO | Admitting: Obstetrics & Gynecology

## 2014-09-13 ENCOUNTER — Encounter: Payer: Self-pay | Admitting: Obstetrics & Gynecology

## 2014-09-13 ENCOUNTER — Ambulatory Visit (INDEPENDENT_AMBULATORY_CARE_PROVIDER_SITE_OTHER): Payer: BLUE CROSS/BLUE SHIELD | Admitting: Obstetrics & Gynecology

## 2014-09-13 VITALS — BP 110/78 | HR 82 | Ht 70.0 in | Wt 191.4 lb

## 2014-09-13 DIAGNOSIS — K219 Gastro-esophageal reflux disease without esophagitis: Secondary | ICD-10-CM | POA: Insufficient documentation

## 2014-09-13 DIAGNOSIS — Z01419 Encounter for gynecological examination (general) (routine) without abnormal findings: Secondary | ICD-10-CM

## 2014-09-13 DIAGNOSIS — Z8673 Personal history of transient ischemic attack (TIA), and cerebral infarction without residual deficits: Secondary | ICD-10-CM | POA: Insufficient documentation

## 2014-09-13 DIAGNOSIS — N951 Menopausal and female climacteric states: Secondary | ICD-10-CM | POA: Insufficient documentation

## 2014-09-13 DIAGNOSIS — B009 Herpesviral infection, unspecified: Secondary | ICD-10-CM | POA: Diagnosis not present

## 2014-09-13 MED ORDER — PAROXETINE HCL 10 MG PO TABS: 10.0000 mg | ORAL_TABLET | Freq: Every day | ORAL | Status: DC

## 2014-09-13 MED ORDER — FLUCONAZOLE 150 MG PO TABS: 150.0000 mg | ORAL_TABLET | Freq: Once | ORAL | Status: DC

## 2014-09-13 MED ORDER — SERTRALINE HCL 25 MG PO TABS: 75.0000 mg | ORAL_TABLET | Freq: Every day | ORAL | Status: DC

## 2014-09-13 MED ORDER — VALACYCLOVIR HCL 1 G PO TABS: 1000.0000 mg | ORAL_TABLET | ORAL | Status: DC | PRN

## 2014-09-13 NOTE — Progress Notes (Signed)
54 y.o. G1P1 MarriedCaucasianF here for annual exam.  Doing well.  No vaginal bleeding.  Has had three injections in back since 2/15.  Has Robaxin and Hydrocodone prn.  Reports increased hot flashes over the last year.  Zoloft really worked well for her for awhile but it seems to her she has developed a tolerance.  Reports, as well, with increased sweating with hot flashes she is having more issues with yeast infections in the summer.  Monistat causes a lot of burning.  Diflucan doesn't do this.  PCP:  Dr. Clarene DukeLittle.  Labs done August, 2015.  Has copies at home and will fax to me.  Patient's last menstrual period was 07/10/1991.          Sexually active: Yes.    The current method of family planning is status post hysterectomy.    Exercising: No.  The patient does not participate in regular exercise at present. Smoker:  no  Health Maintenance: Pap:  12/17/2008 Negative  History of abormal Pap:  no MMG:  06/2013.  3D MMG.  Appt is scheduled for 09/14/14 Colonoscopy:  05/16/2012 f/u in 10 years BMD:   Never had one TDaP:  02/18/13  Screening Labs: no, done by Catha GosselinKevin Little, MD     Past Medical History  Diagnosis Date  . GERD (gastroesophageal reflux disease)   . Hyperhomocysteinemia   . Migraine   . TIA (transient ischemic attack) 05/08/02    hyperhomocysteinemia  . HSV-1 infection     genital    Past Surgical History  Procedure Laterality Date  . Abdominal hysterectomy      fibroids  . Injection to spider vein    . Tumor removed      benign, neck  . Tonsillectomy      Family History  Problem Relation Age of Onset  . Diabetes Mother   . Hypertension Mother   . Other Mother     Karlene LinemanGuillian Barre  . Diabetes Maternal Grandmother   . Diabetes Maternal Grandfather   . Hypertension Sister   . Aneurysm Sister   . Hypertension Father   . Hypertension Other     all grandparents  . Heart disease Paternal Grandmother   . Heart disease Paternal Grandfather   . Alcoholism Father      ROS:  Pertinent items are noted in HPI.  Otherwise, a comprehensive ROS was negative.  Exam:   BP 110/78 mmHg  Pulse 82  Ht 5\' 10"  (1.778 m)  Wt 191 lb 6.4 oz (86.818 kg)  BMI 27.46 kg/m2  LMP 07/10/1991  Weight change: +4#  Height: 5\' 10"  (177.8 cm)  Ht Readings from Last 3 Encounters:  09/13/14 5\' 10"  (1.778 m)  08/06/13 5' 10.5" (1.791 m)  05/16/13 5\' 10"  (1.778 m)    General appearance: alert, cooperative and appears stated age Head: Normocephalic, without obvious abnormality, atraumatic Neck: no adenopathy, supple, symmetrical, trachea midline and thyroid normal to inspection and palpation Lungs: clear to auscultation bilaterally Breasts: normal appearance, no masses or tenderness Heart: regular rate and rhythm Abdomen: soft, non-tender; bowel sounds normal; no masses,  no organomegaly Extremities: extremities normal, atraumatic, no cyanosis or edema Skin: Skin color, texture, turgor normal. No rashes or lesions Lymph nodes: Cervical, supraclavicular, and axillary nodes normal. No abnormal inguinal nodes palpated Neurologic: Grossly normal   Pelvic: External genitalia:  no lesions              Urethra:  normal appearing urethra with no masses, tenderness or lesions  Bartholins and Skenes: normal                 Vagina: normal appearing vagina with normal color and discharge, no lesions              Cervix: absent              Pap taken: No. Bimanual Exam:  Uterus:  uterus absent              Adnexa: no mass, fullness, tenderness               Rectovaginal: Confirms               Anus:  normal sphincter tone, no lesions  Chaperone was present for exam.  A:  Well Woman with normal exam H/O TAH, ovaries remain H/O migraines H/O remote TIA and low homocysteine level 10/03 H/O HSV 1 (genital) Hot flashes Smoking cessation 09/22/13.    P: Mammogram yearly pap smear not indicated Valtrex 1gm daily. #90/4RF Paxil  daily.  #90/4RF Labs  with Dr. Clarene Duke.  Pt will fax to me. return annually or prn

## 2014-10-25 ENCOUNTER — Other Ambulatory Visit: Payer: Self-pay | Admitting: Obstetrics & Gynecology

## 2014-10-26 NOTE — Telephone Encounter (Signed)
Medication refill request: Zoloft  Last AEX:  09/13/14 SM Next AEX: 09/19/15 SM Last MMG (if hormonal medication request): 06/20/13 Normal  [facility]ill [facility] 09/13/14 [facility] [address]

## 2014-12-01 ENCOUNTER — Telehealth: Payer: Self-pay | Admitting: Obstetrics & Gynecology

## 2014-12-01 NOTE — Telephone Encounter (Signed)
Spoke with patient. Patient states that since starting Paxil for her hot flashes and night sweats she has not noticed a difference in how she is feeling. I still experiencing multiple hot flashes daily and not able to sleep well due to night sweats. Was switched from Zoloft to Paxil on 09/13/2014 at aex with Dr.Miller. Patient is interested in starting a low dose progesterone pill as previously discussed with Dr.Miller. Advised will need to speak with Dr.Miller regarding medication change and return call with further recommendations. Patient is agreeable.

## 2014-12-01 NOTE — Telephone Encounter (Signed)
OK to start Prometrium 100mg  nightly.  Generic substitution is ok.  #30/2RF.  I would like an update in 4-6 weeks.  Thanks.

## 2014-12-01 NOTE — Telephone Encounter (Signed)
Patient called. States that she needs to switch medication for hot flashes.

## 2014-12-02 MED ORDER — PROGESTERONE MICRONIZED 100 MG PO CAPS: 100.0000 mg | ORAL_CAPSULE | Freq: Every evening | ORAL | Status: DC

## 2014-12-02 NOTE — Telephone Encounter (Signed)
Called patient and message from Dr. Hyacinth MeekerMiller given. Patient agreeable to plan. She is given instructions on how to take Prometrium and to follow up in 4-6 weeks. Patient verbalized understanding and will follow up as directed or call back earlier if any concerns. Routing to provider for final review. Patient agreeable to disposition. Will close encounter.

## 2015-01-24 ENCOUNTER — Ambulatory Visit: Payer: Self-pay | Admitting: Obstetrics & Gynecology

## 2015-03-03 ENCOUNTER — Other Ambulatory Visit: Payer: Self-pay | Admitting: Cardiology

## 2015-03-03 ENCOUNTER — Ambulatory Visit
Admission: RE | Admit: 2015-03-03 | Discharge: 2015-03-03 | Disposition: A | Discharge status: Home / Self Care | Payer: Commercial Managed Care - PPO | Source: Ambulatory Visit | Attending: Cardiology | Admitting: Cardiology

## 2015-03-03 DIAGNOSIS — R0789 Other chest pain: Secondary | ICD-10-CM

## 2015-04-02 ENCOUNTER — Other Ambulatory Visit: Payer: Self-pay | Admitting: Obstetrics & Gynecology

## 2015-04-04 NOTE — Telephone Encounter (Signed)
Left voice mail to call back 

## 2015-04-04 NOTE — Telephone Encounter (Signed)
Medication refill request: Progesterone  Last AEX:  09/13/14 SM Next AEX: 09/19/15 SM Last MMG (if hormonal medication request): 06/30/13 Normal  Refill authorized: 12/02/14 #30caps/2R. Today please advise.    Jerene Bears, MD at 12/01/2014 11:07 PM     Status: Signed       Expand All Collapse All   OK to start Prometrium  nightly. Generic substitution is ok. #30/2RF. I would like an update in 4-6 weeks       Patient canceled f/u appt from 01/24/15. Please advise.

## 2015-04-04 NOTE — Telephone Encounter (Signed)
Please see how she is doing.  Ok to RF is she is feeling symptom improvement.  Pt cannot take estrogen so using this to see if hot flashes decrease.

## 2015-04-05 NOTE — Telephone Encounter (Signed)
Spoke with patient. Patient states that she is doing well with taking Prometrium 100 mg nightly. States she has had decrease in her symptoms. Would like to continue medication at this time. Rx for Progesterone 100 mg nightly #30 5RF until aex sent in for patient.  Routing to Dr.Jertson for review as Dr.Miller is out of the office this week.  Routing to provider for final review. Patient agreeable to disposition. Will close encounter.

## 2015-09-19 ENCOUNTER — Ambulatory Visit (INDEPENDENT_AMBULATORY_CARE_PROVIDER_SITE_OTHER): Payer: Commercial Managed Care - PPO | Admitting: Obstetrics & Gynecology

## 2015-09-19 ENCOUNTER — Encounter: Payer: Self-pay | Admitting: Obstetrics & Gynecology

## 2015-09-19 VITALS — BP 112/78 | HR 74 | Resp 16 | Ht 69.75 in | Wt 199.0 lb

## 2015-09-19 DIAGNOSIS — Z01419 Encounter for gynecological examination (general) (routine) without abnormal findings: Secondary | ICD-10-CM | POA: Diagnosis not present

## 2015-09-19 MED ORDER — SERTRALINE HCL 25 MG PO TABS: 75.0000 mg | ORAL_TABLET | Freq: Every day | ORAL | Status: DC

## 2015-09-19 MED ORDER — PROGESTERONE MICRONIZED 100 MG PO CAPS: 100.0000 mg | ORAL_CAPSULE | Freq: Every day | ORAL | Status: DC

## 2015-09-19 MED ORDER — VALACYCLOVIR HCL 1 G PO TABS: 1000.0000 mg | ORAL_TABLET | ORAL | Status: DC | PRN

## 2015-09-19 MED ORDER — FLUCONAZOLE 150 MG PO TABS: 150.0000 mg | ORAL_TABLET | Freq: Once | ORAL | Status: DC
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
## 2016-05-17 ENCOUNTER — Encounter: Payer: Self-pay | Admitting: Obstetrics & Gynecology

## 2016-06-27 ENCOUNTER — Other Ambulatory Visit: Payer: Self-pay | Admitting: Obstetrics & Gynecology

## 2016-11-13 ENCOUNTER — Ambulatory Visit (INDEPENDENT_AMBULATORY_CARE_PROVIDER_SITE_OTHER): Payer: 59 | Admitting: Obstetrics & Gynecology

## 2016-11-13 ENCOUNTER — Encounter: Payer: Self-pay | Admitting: Obstetrics & Gynecology

## 2016-11-13 VITALS — BP 118/88 | HR 90 | Resp 18 | Ht 70.0 in | Wt 198.0 lb

## 2016-11-13 DIAGNOSIS — Z01419 Encounter for gynecological examination (general) (routine) without abnormal findings: Secondary | ICD-10-CM | POA: Diagnosis not present

## 2016-11-13 MED ORDER — VALACYCLOVIR HCL 1 G PO TABS: 1000.0000 mg | ORAL_TABLET | ORAL | 12 refills | Status: DC | PRN

## 2016-11-13 MED ORDER — FLUCONAZOLE 150 MG PO TABS: 150.0000 mg | ORAL_TABLET | Freq: Once | ORAL | 2 refills | Status: AC

## 2016-11-13 MED ORDER — VENLAFAXINE HCL ER 75 MG PO CP24: 75.0000 mg | ORAL_CAPSULE | Freq: Every day | ORAL | 12 refills | Status: DC

## 2016-11-13 MED ORDER — GABAPENTIN 100 MG PO CAPS: ORAL_CAPSULE | ORAL | 0 refills | Status: DC

## 2016-11-13 NOTE — Progress Notes (Signed)
56 y.o. G1P1 MarriedCaucasianF here for annual exam.  Very sad today.  Mother died a month ago.  Trial for her assault ended today.  He was let off on a technicality.    Denies vaginal bleeding.    PCP:  Dr. Clarene DukeLittle.  She will do blood work with him at next visit, including Hep C testing.  Patient's last menstrual period was 07/10/1991.          Sexually active: Yes.    The current method of family planning is status post hysterectomy.    Exercising: No.  The patient does not participate in regular exercise at present. Smoker:  E-Cigarettes   Health Maintenance:  Pap:  12/17/08 Neg  History of abnormal Pap:  no MMG:  04/28/16 BIRADS2:Benign  Colonoscopy:  05/16/12 f/u 5-10 years  BMD:   Never TDaP:  02/2013  Pneumonia vaccine(s):  No Zostavax:   No Hep C testing: Will do with PCP Screening Labs: PCP   reports that she has quit smoking. Her smoking use included Cigarettes. She smoked 0.50 packs per day. She has never used smokeless tobacco. She reports that she drinks alcohol. She reports that she does not use drugs.  Past Medical History:  Diagnosis Date  . GERD (gastroesophageal reflux disease)   . HSV-1 infection    genital  . Hyperhomocysteinemia (HCC)   . Migraine   . TIA (transient ischemic attack) 05/08/02   hyperhomocysteinemia    Past Surgical History:  Procedure Laterality Date  . ABDOMINAL HYSTERECTOMY     fibroids  . injection to spider vein    . TONSILLECTOMY    . tumor removed     benign, neck    Current Outpatient Prescriptions  Medication Sig Dispense Refill  . HYDROcodone-acetaminophen (NORCO) 7.5-325 MG tablet TK 1 T PO QID PRN  0  . methocarbamol (ROBAXIN) 500 MG tablet as needed.  0  . Multiple Vitamins-Minerals (MULTIVITAMIN PO) Take by mouth daily.    . pantoprazole (PROTONIX) 40 MG tablet daily.  2  . promethazine (PHENERGAN) 25 MG tablet Take 25 mg by mouth every 6 (six) hours as needed for nausea or vomiting.    . Sennosides (SENOKOT PO) Take  by mouth as needed. Two daily    . valACYclovir (VALTREX) 1000 MG tablet Take 1 tablet (1,000 mg total) by mouth as needed. 90 tablet 4  . venlafaxine XR (EFFEXOR-XR) 75 MG 24 hr capsule Take 1 capsule by mouth daily.    . fluconazole (DIFLUCAN) 150 MG tablet Take 1 tablet (150 mg total) by mouth once. Take one tablet.  Repeat in 48 hours if needed. (Patient not taking: Reported on 11/13/2016) 2 tablet 2   No current facility-administered medications for this visit.     Family History  Problem Relation Age of Onset  . Diabetes Mother   . Hypertension Mother   . Other Mother     Karlene LinemanGuillian Barre  . Diabetes Maternal Grandmother   . Diabetes Maternal Grandfather   . Hypertension Sister   . Aneurysm Sister   . Hypertension Father   . Alcoholism Father   . Hypertension Other     all grandparents  . Heart disease Paternal Grandmother   . Heart disease Paternal Grandfather     ROS:  Pertinent items are noted in HPI.  Otherwise, a comprehensive ROS was negative.  Exam:   BP 118/88 (BP Location: Right Arm, Patient Position: Sitting, Cuff Size: Large)   Pulse 90   Resp 18  Ht 5\' 10"  (1.778 m)   Wt 198 lb (89.8 kg)   LMP 07/10/1991   BMI 28.41 kg/m   Weight change: -1#  Height: 5\' 10"  (177.8 cm)  Ht Readings from Last 3 Encounters:  11/13/16 5\' 10"  (1.778 m)  09/19/15 5' 9.75" (1.772 m)  09/13/14 5\' 10"  (1.778 m)    General appearance: alert, cooperative and appears stated age Head: Normocephalic, without obvious abnormality, atraumatic Neck: no adenopathy, supple, symmetrical, trachea midline and thyroid normal to inspection and palpation Lungs: clear to auscultation bilaterally Breasts: normal appearance, no masses or tenderness Heart: regular rate and rhythm Abdomen: soft, non-tender; bowel sounds normal; no masses,  no organomegaly Extremities: extremities normal, atraumatic, no cyanosis or edema Skin: Skin color, texture, turgor normal. No rashes or lesions Lymph nodes:  Cervical, supraclavicular, and axillary nodes normal. No abnormal inguinal nodes palpated Neurologic: Grossly normal   Pelvic: External genitalia:  no lesions              Urethra:  normal appearing urethra with no masses, tenderness or lesions              Bartholins and Skenes: normal                 Vagina: normal appearing vagina with normal color and discharge, no lesions              Cervix: absent              Pap taken: No. Bimanual Exam:  Uterus:  uterus absent              Adnexa: no mass, fullness, tenderness               Rectovaginal: Confirms               Anus:  normal sphincter tone, no lesions  Chaperone was present for exam.  A:  Well Woman with normal exam H/O TAH, ovaries remain Migraines Remote hx of TIA and low homocysteine level 27-Apr-2023 Stressors with recent death of mother and trial that ended today  P:   Mammogram guidelines reviewed pap smear not indicated Using E cigarettes now with increased stressors Valtrex 1 gm daily.  #30/12RF Effexor XR 75mg  daily.  #30/12RF Trial of gabapentin 100mg  nightly.  Increase to 200mg  after a week and then 300mg  after another week.  Rx sent in for pt.  She will give me an update in 3-4 weeks Diflucan 150mg  po x 1, repeat 72 hours.  #2/2RF Return annually or prn

## 2016-12-13 ENCOUNTER — Telehealth: Payer: Self-pay | Admitting: Obstetrics & Gynecology

## 2016-12-13 NOTE — Telephone Encounter (Signed)
Patient was started on Neurontin 100 mg on 11/13/2016. She was to take 100 mg nightly for 7 days, then increase to 200 mg nightly for 7 days, then increase to 300 mg nightly. Patient is now taking 300 mg nightly and would like Dr.Miller know she is doing well on this dose.  Dr.Miller, would you like me to write her an rx for Neurontin 300 mg nightly with refills as first rx was given for 1 month without refills?

## 2016-12-13 NOTE — Telephone Encounter (Signed)
Patient wanted to let Dr Hyacinth MeekerMiller know that 300mg  of the Neurontin works best for her.

## 2016-12-14 MED ORDER — GABAPENTIN 300 MG PO CAPS: 300.0000 mg | ORAL_CAPSULE | Freq: Every day | ORAL | 12 refills | Status: DC

## 2016-12-14 NOTE — Telephone Encounter (Signed)
Patient called to check status of Nuerontin 300mg  refill. She took her last dose last night and is completely out of her medication.

## 2016-12-17 NOTE — Telephone Encounter (Signed)
Spoke with patient and she received the new RX for her Neurontin-eh

## 2017-01-01 ENCOUNTER — Ambulatory Visit: Payer: Commercial Managed Care - PPO | Admitting: Obstetrics & Gynecology

## 2017-03-06 DIAGNOSIS — M5417 Radiculopathy, lumbosacral region: Secondary | ICD-10-CM | POA: Diagnosis not present

## 2017-03-06 DIAGNOSIS — G4700 Insomnia, unspecified: Secondary | ICD-10-CM | POA: Diagnosis not present

## 2017-03-06 DIAGNOSIS — Z79899 Other long term (current) drug therapy: Secondary | ICD-10-CM | POA: Diagnosis not present

## 2017-03-06 DIAGNOSIS — Z8669 Personal history of other diseases of the nervous system and sense organs: Secondary | ICD-10-CM | POA: Diagnosis not present

## 2017-03-06 DIAGNOSIS — K210 Gastro-esophageal reflux disease with esophagitis: Secondary | ICD-10-CM | POA: Diagnosis not present

## 2017-04-11 DIAGNOSIS — R944 Abnormal results of kidney function studies: Secondary | ICD-10-CM | POA: Diagnosis not present

## 2017-05-07 DIAGNOSIS — Z1231 Encounter for screening mammogram for malignant neoplasm of breast: Secondary | ICD-10-CM | POA: Diagnosis not present

## 2017-05-11 DIAGNOSIS — M48061 Spinal stenosis, lumbar region without neurogenic claudication: Secondary | ICD-10-CM | POA: Diagnosis not present

## 2017-05-14 ENCOUNTER — Encounter: Payer: Self-pay | Admitting: Obstetrics & Gynecology

## 2017-07-24 DIAGNOSIS — M5136 Other intervertebral disc degeneration, lumbar region: Secondary | ICD-10-CM

## 2017-07-24 DIAGNOSIS — M51369 Other intervertebral disc degeneration, lumbar region without mention of lumbar back pain or lower extremity pain: Secondary | ICD-10-CM | POA: Insufficient documentation

## 2017-07-24 HISTORY — DX: Other intervertebral disc degeneration, lumbar region without mention of lumbar back pain or lower extremity pain: M51.369

## 2017-07-24 HISTORY — DX: Other intervertebral disc degeneration, lumbar region: M51.36

## 2017-08-24 DIAGNOSIS — M5136 Other intervertebral disc degeneration, lumbar region: Secondary | ICD-10-CM | POA: Diagnosis not present

## 2017-11-23 DIAGNOSIS — M5136 Other intervertebral disc degeneration, lumbar region: Secondary | ICD-10-CM | POA: Diagnosis not present

## 2017-12-03 ENCOUNTER — Other Ambulatory Visit: Payer: Self-pay | Admitting: Obstetrics & Gynecology

## 2017-12-03 NOTE — Telephone Encounter (Signed)
Medication refill request: gabapentin  Last AEX:  11/13/16 SM  Next AEX: 01/24/18  Last MMG (if hormonal medication request): 05/07/17 BIRADS 2 benign  Refill authorized: 12/14/16 #30, 12 RF. Today, please advise.    Call to patient. Patient confirmed pharmacy as [facility] in [place].

## 2018-01-24 ENCOUNTER — Other Ambulatory Visit: Payer: Self-pay

## 2018-01-24 ENCOUNTER — Ambulatory Visit: Payer: BLUE CROSS/BLUE SHIELD | Admitting: Obstetrics & Gynecology

## 2018-01-24 ENCOUNTER — Encounter: Payer: Self-pay | Admitting: Obstetrics & Gynecology

## 2018-01-24 VITALS — BP 144/86 | HR 88 | Resp 16 | Ht 70.0 in | Wt 210.2 lb

## 2018-01-24 DIAGNOSIS — Z01419 Encounter for gynecological examination (general) (routine) without abnormal findings: Secondary | ICD-10-CM

## 2018-01-24 MED ORDER — GABAPENTIN 300 MG PO CAPS: 300.0000 mg | ORAL_CAPSULE | Freq: Every day | ORAL | 4 refills | Status: DC

## 2018-01-24 MED ORDER — VALACYCLOVIR HCL 1 G PO TABS: 1000.0000 mg | ORAL_TABLET | ORAL | 4 refills | Status: DC | PRN

## 2018-01-24 MED ORDER — VENLAFAXINE HCL ER 75 MG PO CP24: 75.0000 mg | ORAL_CAPSULE | Freq: Every day | ORAL | 4 refills | Status: DC

## 2018-01-24 NOTE — Progress Notes (Signed)
57 y.o. G1P1 MarriedCaucasianF here for annual exam.  Doing well.  Having epidural injections every three months with Dr. Ethelene Halamos.  Denies vaginal bleeding.  [place]   Feels like she is hot all of the time but doesn't have hot flashes, per se.    PCP:  Dr. Clarene DukeLittle.  Will have blood work in the fall.  Patient's last menstrual period was 07/10/1991.          Sexually active: Yes.    The current method of family planning is status post hysterectomy.    Exercising: No.   Smoker:  no  Health Maintenance: Pap:  12/17/08 Neg  History of abnormal Pap:  no MMG:  05/07/17 BIRADS2:Benign  Colonoscopy:  05/16/12 f/u 10 years  BMD:   never TDaP:  2014 Pneumonia vaccine(s):  n/a Shingrix:   Declines Shingrix Hep C testing: 2018 Screening Labs: PCP   reports that she has quit smoking. Her smoking use included cigarettes. She smoked 0.50 packs per day. She has never used smokeless tobacco. She reports that she drinks alcohol. She reports that she does not use drugs.  Past Medical History:  Diagnosis Date  . GERD (gastroesophageal reflux disease)   . HSV-1 infection    genital  . Hyperhomocysteinemia (HCC)   . Migraine   . TIA (transient ischemic attack) 05/08/02   hyperhomocysteinemia    Past Surgical History:  Procedure Laterality Date  . ABDOMINAL HYSTERECTOMY     fibroids  . injection to spider vein    . TONSILLECTOMY    . tumor removed     benign, neck    Current Outpatient Medications  Medication Sig Dispense Refill  . gabapentin (NEURONTIN) 300 MG capsule TAKE 1 CAPSULE BY MOUTH AT BEDTIME 30 capsule 1  . HYDROcodone-acetaminophen (NORCO) 7.5-325 MG tablet TK 1 T PO QID PRN  0  . methocarbamol (ROBAXIN) 500 MG tablet as needed.  0  . Multiple Vitamins-Minerals (MULTIVITAMIN PO) Take by mouth daily.    . pantoprazole (PROTONIX) 40 MG tablet daily.  2  . promethazine (PHENERGAN) 25 MG tablet Take 25 mg by mouth every 6  (six) hours as needed for nausea or vomiting.    . valACYclovir (VALTREX) 1000 MG tablet Take 1 tablet (1,000 mg total) by mouth as needed. 30 tablet 12  . venlafaxine XR (EFFEXOR-XR) 75 MG 24 hr capsule Take 1 capsule (75 mg total) by mouth daily. 30 capsule 12   No current facility-administered medications for this visit.     Family History  Problem Relation Age of Onset  . Diabetes Mother   . Hypertension Mother   . Other Mother        Karlene LinemanGuillian Barre  . Diabetes Maternal Grandmother   . Diabetes Maternal Grandfather   . Hypertension Sister   . Aneurysm Sister   . Hypertension Father   . Alcoholism Father   . Hypertension Other        all grandparents  . Heart disease Paternal Grandmother   . Heart disease Paternal Grandfather     Review of Systems  Constitutional:       Weight gain   Genitourinary:       Loss of sexual interest   Endo/Heme/Allergies:       Heat intolerance   All other systems reviewed and are negative.   Exam:   BP (!) 144/86 (BP Location: Right Arm, Patient Position: Sitting, Cuff Size: Large)   Pulse  88   Resp 16   Ht 5\' 10"  (1.778 m)   Wt 210 lb 3.2 oz (95.3 kg)   LMP 07/10/1991   BMI 30.16 kg/m   Height: 5\' 10"  (177.8 cm)  Ht Readings from Last 3 Encounters:  01/24/18 5\' 10"  (1.778 m)  11/13/16 5\' 10"  (1.778 m)  09/19/15 5' 9.75" (1.772 m)    General appearance: alert, cooperative and appears stated age Head: Normocephalic, without obvious abnormality, atraumatic Neck: no adenopathy, supple, symmetrical, trachea midline and thyroid normal to inspection and palpation Lungs: clear to auscultation bilaterally Breasts: normal appearance, no masses or tenderness Heart: regular rate and rhythm Abdomen: soft, non-tender; bowel sounds normal; no masses,  no organomegaly Extremities: extremities normal, atraumatic, no cyanosis or edema Skin: Skin color, texture, turgor normal. No rashes or lesions Lymph nodes: Cervical, supraclavicular, and  axillary nodes normal. No abnormal inguinal nodes palpated Neurologic: Grossly normal   Pelvic: External genitalia:  no lesions              Urethra:  normal appearing urethra with no masses, tenderness or lesions              Bartholins and Skenes: normal                 Vagina: normal appearing vagina with normal color and discharge, no lesions              Cervix: absent              Pap taken: No. Bimanual Exam:  Uterus:  uterus absent              Adnexa: no mass, fullness, tenderness               Rectovaginal: Confirms               Anus:  normal sphincter tone, no lesions  Chaperone was present for exam.  A:  Well Woman with normal exam PMP, no HRT H/o TAH, ovaries remain Migraines Remote hx of TIA and low homocysteine level 10/03  P:   Mammogram guidelines reviewed pap smear not indicated Valtrex 1 gm daily.  #90/4RF Effexor XR 75mg  daily.  #90/4RF Gabapentin 300mg  qhs #90/4RF Lab work will be done in the fall with Dr. Clarene Duke return annually or prn

## 2018-02-20 DIAGNOSIS — K210 Gastro-esophageal reflux disease with esophagitis: Secondary | ICD-10-CM | POA: Diagnosis not present

## 2018-02-20 DIAGNOSIS — Z13220 Encounter for screening for lipoid disorders: Secondary | ICD-10-CM | POA: Diagnosis not present

## 2018-02-20 DIAGNOSIS — Z8669 Personal history of other diseases of the nervous system and sense organs: Secondary | ICD-10-CM | POA: Diagnosis not present

## 2018-02-20 DIAGNOSIS — M5417 Radiculopathy, lumbosacral region: Secondary | ICD-10-CM | POA: Diagnosis not present

## 2018-02-20 DIAGNOSIS — Z79899 Other long term (current) drug therapy: Secondary | ICD-10-CM | POA: Diagnosis not present

## 2018-02-20 DIAGNOSIS — N951 Menopausal and female climacteric states: Secondary | ICD-10-CM | POA: Diagnosis not present

## 2018-04-10 DIAGNOSIS — M5136 Other intervertebral disc degeneration, lumbar region: Secondary | ICD-10-CM | POA: Diagnosis not present

## 2018-04-22 DIAGNOSIS — R1011 Right upper quadrant pain: Secondary | ICD-10-CM | POA: Diagnosis not present

## 2018-04-22 DIAGNOSIS — R197 Diarrhea, unspecified: Secondary | ICD-10-CM | POA: Diagnosis not present

## 2018-05-10 DIAGNOSIS — Z1231 Encounter for screening mammogram for malignant neoplasm of breast: Secondary | ICD-10-CM | POA: Diagnosis not present

## 2018-05-14 DIAGNOSIS — N6001 Solitary cyst of right breast: Secondary | ICD-10-CM | POA: Diagnosis not present

## 2018-05-14 DIAGNOSIS — N6322 Unspecified lump in the left breast, upper inner quadrant: Secondary | ICD-10-CM | POA: Diagnosis not present

## 2018-05-26 ENCOUNTER — Other Ambulatory Visit: Payer: Self-pay | Admitting: Radiology

## 2018-05-26 DIAGNOSIS — N6011 Diffuse cystic mastopathy of right breast: Secondary | ICD-10-CM | POA: Diagnosis not present

## 2018-05-26 DIAGNOSIS — N6002 Solitary cyst of left breast: Secondary | ICD-10-CM | POA: Diagnosis not present

## 2018-05-26 DIAGNOSIS — N6314 Unspecified lump in the right breast, lower inner quadrant: Secondary | ICD-10-CM | POA: Diagnosis not present

## 2018-07-15 DIAGNOSIS — L720 Epidermal cyst: Secondary | ICD-10-CM | POA: Diagnosis not present

## 2018-07-15 DIAGNOSIS — L820 Inflamed seborrheic keratosis: Secondary | ICD-10-CM | POA: Diagnosis not present

## 2018-07-15 DIAGNOSIS — L821 Other seborrheic keratosis: Secondary | ICD-10-CM | POA: Diagnosis not present

## 2018-07-15 DIAGNOSIS — L918 Other hypertrophic disorders of the skin: Secondary | ICD-10-CM | POA: Diagnosis not present

## 2018-07-15 DIAGNOSIS — Z419 Encounter for procedure for purposes other than remedying health state, unspecified: Secondary | ICD-10-CM | POA: Diagnosis not present

## 2018-07-22 ENCOUNTER — Encounter: Payer: Self-pay | Admitting: Obstetrics & Gynecology

## 2018-07-31 DIAGNOSIS — M5136 Other intervertebral disc degeneration, lumbar region: Secondary | ICD-10-CM | POA: Diagnosis not present

## 2018-07-31 DIAGNOSIS — I10 Essential (primary) hypertension: Secondary | ICD-10-CM | POA: Diagnosis not present

## 2018-08-27 DIAGNOSIS — I10 Essential (primary) hypertension: Secondary | ICD-10-CM | POA: Diagnosis not present

## 2018-10-14 DIAGNOSIS — M5136 Other intervertebral disc degeneration, lumbar region: Secondary | ICD-10-CM | POA: Diagnosis not present

## 2019-01-29 DIAGNOSIS — M5136 Other intervertebral disc degeneration, lumbar region: Secondary | ICD-10-CM | POA: Diagnosis not present

## 2019-02-20 ENCOUNTER — Other Ambulatory Visit: Payer: Self-pay | Admitting: Obstetrics & Gynecology

## 2019-02-20 NOTE — Telephone Encounter (Signed)
Medication refill request: gabapentin Last AEX:  01/24/18 Next AEX: 05/19/19 Last MMG (if hormonal medication request): 05/26/18 left breast cyst aspiration return annually  Refill authorized: #90 with 0 RF pended for today.    Medication refill request: Effexor XR  Last AEX:  01/24/18 Next AEX: 05/19/19 Last MMG (if hormonal medication request): 05/26/18 left breast cyst aspiration return annually  Refill authorized: #90 with 0 RF pended for today.

## 2019-03-08 DIAGNOSIS — R000 Tachycardia, unspecified: Secondary | ICD-10-CM | POA: Diagnosis not present

## 2019-03-08 DIAGNOSIS — I951 Orthostatic hypotension: Secondary | ICD-10-CM | POA: Diagnosis not present

## 2019-05-15 ENCOUNTER — Other Ambulatory Visit: Payer: Self-pay

## 2019-05-19 ENCOUNTER — Ambulatory Visit (INDEPENDENT_AMBULATORY_CARE_PROVIDER_SITE_OTHER): Payer: 59 | Admitting: Obstetrics & Gynecology

## 2019-05-19 ENCOUNTER — Encounter: Payer: Self-pay | Admitting: Obstetrics & Gynecology

## 2019-05-19 ENCOUNTER — Encounter

## 2019-05-19 ENCOUNTER — Other Ambulatory Visit: Payer: Self-pay

## 2019-05-19 VITALS — BP 118/72 | HR 80 | Temp 97.3°F | Resp 12 | Ht 70.0 in | Wt 190.6 lb

## 2019-05-19 DIAGNOSIS — Z01419 Encounter for gynecological examination (general) (routine) without abnormal findings: Secondary | ICD-10-CM

## 2019-05-19 MED ORDER — VALACYCLOVIR HCL 1 G PO TABS: 1000.0000 mg | ORAL_TABLET | ORAL | 4 refills | Status: DC | PRN

## 2019-05-19 MED ORDER — GABAPENTIN 300 MG PO CAPS: ORAL_CAPSULE | ORAL | 4 refills | Status: DC

## 2019-05-19 MED ORDER — PROMETHAZINE HCL 25 MG PO TABS: 25.0000 mg | ORAL_TABLET | Freq: Four times a day (QID) | ORAL | 1 refills | Status: DC | PRN

## 2019-05-19 MED ORDER — VENLAFAXINE HCL ER 75 MG PO CP24: 75.0000 mg | ORAL_CAPSULE | Freq: Every evening | ORAL | 4 refills | Status: DC

## 2019-05-19 NOTE — Progress Notes (Addendum)
58 y.o. G1P1 Married White or Caucasian female here for annual exam.  Has been working on weight loss.  She got up to 222# in the late winter.  Blood pressure was elevated.  She stopped eating junk food.  Stopped soft drinks.  Isn't really exercising.    Continued to have hot flashes but it is better with weight loss.  Hot flashes are not as severe either.    Working full time but working from home.    Denies vaginal bleeding.    Patient's last menstrual period was 07/10/1991.          Sexually active: Yes.    The current method of family planning is status post hysterectomy.    Exercising: No.  The patient does not participate in regular exercise at present. Smoker:  Former  Health Maintenance: Pap:  12/17/08 Neg  History of abnormal Pap:  no MMG:  05/10/18, 05/14/18, 05/26/18 -- patient had bilateral breast biopsies, all were negative.  Has it scheduled for this week.   Colonoscopy:  05/16/12 f/u 10 years  BMD:   never TDaP:  2014 Pneumonia vaccine(s):  n/a Shingrix:   never Hep C testing: 2018 negative Screening Labs: PCP   reports that she has quit smoking. Her smoking use included cigarettes. She smoked 0.50 packs per day. She has never used smokeless tobacco. She reports current alcohol use. She reports that she does not use drugs.  Past Medical History:  Diagnosis Date  . Degeneration of lumbar intervertebral disc 07/24/2017  . GERD (gastroesophageal reflux disease)   . HSV-1 infection    genital  . Hyperhomocysteinemia ([place])   . Hypertension   . Migraine   . TIA (transient ischemic attack) 05/08/02   hyperhomocysteinemia    Past Surgical History:  Procedure Laterality Date  . ABDOMINAL HYSTERECTOMY     fibroids  . injection to spider vein    . TONSILLECTOMY    . tumor removed     benign, neck    Current Outpatient Medications  Medication Sig Dispense Refill  . gabapentin (NEURONTIN) 300 MG capsule TAKE 1 CAPSULE(300 MG) BY MOUTH AT BEDTIME 90 capsule 0  .  HYDROcodone-acetaminophen (NORCO) 7.5-325 MG tablet TK 1 T PO QID PRN  0  . lisinopril-hydrochlorothiazide (ZESTORETIC) 20-12.5 MG tablet Take 1 tablet by mouth daily.    . methocarbamol (ROBAXIN) 500 MG tablet as needed.  0  . Multiple Vitamins-Minerals (MULTIVITAMIN PO) Take by mouth daily.    . pantoprazole (PROTONIX) 40 MG tablet daily.  2  . promethazine (PHENERGAN) 25 MG tablet Take 25 mg by mouth every 6 (six) hours as needed for nausea or vomiting.    . valACYclovir (VALTREX) 1000 MG tablet Take 1 tablet (1,000 mg total) by mouth as needed. 90 tablet 4  . venlafaxine XR (EFFEXOR-XR) 75 MG 24 hr capsule TAKE 1 CAPSULE(75 MG) BY MOUTH DAILY 90 capsule 0   No current facility-administered medications for this visit.     Family History  Problem Relation Age of Onset  . Diabetes Mother   . Hypertension Mother   . Other Mother        Jossie Ng  . Diabetes Maternal Grandmother   . Diabetes Maternal Grandfather   . Hypertension Sister   . Aneurysm Sister   . Hypertension Father   . Alcoholism Father   . Hypertension Other        all grandparents  . Heart disease Paternal Grandmother   . Heart disease Paternal Grandfather  Review of Systems  All other systems reviewed and are negative.   Exam:   BP 118/72 (BP Location: Right Arm, Patient Position: Sitting, Cuff Size: Normal)   Pulse 80   Temp (!) 97.3 F (36.3 C) (Temporal)   Resp 12   Ht 5\' 10"  (1.778 m)   Wt 190 lb 9.6 oz (86.5 kg)   LMP 07/10/1991   BMI 27.35 kg/m    Height: 5\' 10"  (177.8 cm)  Ht Readings from Last 3 Encounters:  05/19/19 5\' 10"  (1.778 m)  01/24/18 5\' 10"  (1.778 m)  11/13/16 5\' 10"  (1.778 m)    General appearance: alert, cooperative and appears stated age Head: Normocephalic, without obvious abnormality, atraumatic Neck: no adenopathy, supple, symmetrical, trachea midline and thyroid normal to inspection and palpation Lungs: clear to auscultation bilaterally Breasts: normal appearance,  no masses or tenderness, bilateral implants present Heart: regular rate and rhythm Abdomen: soft, non-tender; bowel sounds normal; no masses,  no organomegaly Extremities: extremities normal, atraumatic, no cyanosis or edema Skin: Skin color, texture, turgor normal. No rashes or lesions Lymph nodes: Cervical, supraclavicular, and axillary nodes normal. No abnormal inguinal nodes palpated Neurologic: Grossly normal   Pelvic: External genitalia:  no lesions              Urethra:  normal appearing urethra with no masses, tenderness or lesions              Bartholins and Skenes: normal                 Vagina: normal appearing vagina with normal color and discharge, no lesions              Cervix: absent              Pap taken: No. Bimanual Exam:  Uterus:  uterus absent              Adnexa: no mass, fullness, tenderness               Rectovaginal: Confirms               Anus:  normal sphincter tone, no lesions  Chaperone was present for exam.  A:  Well Woman with normal exam PMP, no HRT H/o TAH, ovaries still present Migraine hx Remote hx hx of TIA with hyperhomocysteinemia   P:   Mammogram guidelines reviewed.  Has appt scheduled for end of the week pap smear not indicated RF for Valtrex 1gm daily.  #90/4RF RF for Effexor XR 75mg  daily.  #90/4RF RF for Gabapentin 300mg  qhs #90/4RF RF for phenergan 25mg  1 tab po prn nausea.  #30/1RF (used to get this from Dr. and never needs RF in the year) Has appt with Dr. 01/26/18 and blood work planned 09/2019 Plan BMD around age 8 Return annually or prn

## 2019-05-23 ENCOUNTER — Encounter: Payer: Self-pay | Admitting: Obstetrics & Gynecology

## 2020-06-23 ENCOUNTER — Other Ambulatory Visit: Payer: Self-pay | Admitting: Obstetrics & Gynecology

## 2020-06-23 NOTE — Telephone Encounter (Signed)
Medication refill request: Venlafaxine Last AEX:  05/19/19 Dr. Hyacinth Meeker Next AEX: none Last MMG (if hormonal medication request): n/a Refill authorized: today, please advise

## 2020-08-09 ENCOUNTER — Other Ambulatory Visit: Payer: Self-pay

## 2020-08-09 ENCOUNTER — Ambulatory Visit (INDEPENDENT_AMBULATORY_CARE_PROVIDER_SITE_OTHER): Payer: 59 | Admitting: Obstetrics & Gynecology

## 2020-08-09 ENCOUNTER — Encounter: Payer: Self-pay | Admitting: Obstetrics & Gynecology

## 2020-08-09 VITALS — BP 123/80 | HR 78 | Ht 71.0 in | Wt 201.0 lb

## 2020-08-09 DIAGNOSIS — N289 Disorder of kidney and ureter, unspecified: Secondary | ICD-10-CM

## 2020-08-09 DIAGNOSIS — Z8673 Personal history of transient ischemic attack (TIA), and cerebral infarction without residual deficits: Secondary | ICD-10-CM

## 2020-08-09 DIAGNOSIS — B009 Herpesviral infection, unspecified: Secondary | ICD-10-CM | POA: Diagnosis not present

## 2020-08-09 DIAGNOSIS — N951 Menopausal and female climacteric states: Secondary | ICD-10-CM | POA: Diagnosis not present

## 2020-08-09 DIAGNOSIS — Z90710 Acquired absence of both cervix and uterus: Secondary | ICD-10-CM

## 2020-08-09 DIAGNOSIS — Z01419 Encounter for gynecological examination (general) (routine) without abnormal findings: Secondary | ICD-10-CM | POA: Diagnosis not present

## 2020-08-09 DIAGNOSIS — G43009 Migraine without aura, not intractable, without status migrainosus: Secondary | ICD-10-CM

## 2020-08-09 DIAGNOSIS — G4700 Insomnia, unspecified: Secondary | ICD-10-CM

## 2020-08-09 MED ORDER — TRAZODONE HCL 100 MG PO TABS: 100.0000 mg | ORAL_TABLET | Freq: Every evening | ORAL | 4 refills | Status: DC | PRN

## 2020-08-09 MED ORDER — VENLAFAXINE HCL ER 75 MG PO CP24: 75.0000 mg | ORAL_CAPSULE | Freq: Every evening | ORAL | 4 refills | Status: DC

## 2020-08-09 MED ORDER — GABAPENTIN 300 MG PO CAPS: ORAL_CAPSULE | ORAL | 4 refills | Status: DC

## 2020-08-09 NOTE — Progress Notes (Signed)
60 y.o. G1P1 Married White or Caucasian female here for annual exam.  Doing well.  Denies vaginal bleeding.  Still working full time.  Not traveling at all with work.  She is still having hot flashes.    Husband is going to retire this year.  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  Saw Dr. Clarene Duke in August of 2021.  Renal function is a little elevated.    Patient's last menstrual period was 07/10/1991.           The current method of family planning is post menopausal status.    Exercising: Yes.    walking in the winter and swimming in the summer Smoker:  no  Health Maintenance: Pap:  12/17/2008 History of abnormal Pap:  no MMG:  05/2019 Colonoscopy:  05/16/2012 BMD:   never TDaP:  2014 Pneumonia vaccine(s):  N/a Shingrix:   n/a Hep C testing: 2018 Screening Labs: Dr. Clarene Duke   reports that she has quit smoking. Her smoking use included cigarettes. She smoked 0.50 packs per day. She has never used smokeless tobacco. She reports current alcohol use. She reports that she does not use drugs.  Past Medical History:  Diagnosis Date  . Degeneration of lumbar intervertebral disc 07/24/2017  . GERD (gastroesophageal reflux disease)   . HSV-1 infection    genital  . Hyperhomocysteinemia (HCC)   . Hypertension   . Migraine   . TIA (transient ischemic attack) 05/08/02   hyperhomocysteinemia    Past Surgical History:  Procedure Laterality Date  . ABDOMINAL HYSTERECTOMY     fibroids  . TONSILLECTOMY    . tumor removed     benign, neck    Current Outpatient Medications  Medication Sig Dispense Refill  . gabapentin (NEURONTIN) 300 MG capsule TAKE 1 CAPSULE(300 MG) BY MOUTH AT BEDTIME 90 capsule 4  . HYDROcodone-acetaminophen (NORCO) 7.5-325 MG tablet TK 1 T PO QID PRN  0  . lisinopril-hydrochlorothiazide (ZESTORETIC) 20-12.5 MG tablet Take 1 tablet by mouth daily.    . methocarbamol (ROBAXIN) 500 MG tablet as needed.  0  . Multiple Vitamins-Minerals  (MULTIVITAMIN PO) Take by mouth daily.    . pantoprazole (PROTONIX) 40 MG tablet daily.  2  . promethazine (PHENERGAN) 25 MG tablet Take 1 tablet (25 mg total) by mouth every 6 (six) hours as needed for nausea or vomiting. 30 tablet 1  . valACYclovir (VALTREX) 1000 MG tablet Take 1 tablet (1,000 mg total) by mouth as needed. 90 tablet 4  . venlafaxine XR (EFFEXOR-XR) 75 MG 24 hr capsule TAKE 1 CAPSULE (75 MG TOTAL) BY MOUTH EVERY EVENING. 90 capsule 0   No current facility-administered medications for this visit.    Family History  Problem Relation Age of Onset  . Diabetes Mother   . Hypertension Mother   . Other Mother        Karlene Lineman  . Diabetes Maternal Grandmother   . Diabetes Maternal Grandfather   . Hypertension Sister   . Aneurysm Sister   . Hypertension Father   . Alcoholism Father   . Hypertension Other        all grandparents  . Heart disease Paternal Grandmother   . Heart disease Paternal Grandfather     Review of Systems  All other systems reviewed and are negative.   Exam:   BP 123/80   Pulse 78   Ht 5\' 11"  (1.803 m)   Wt 201 lb (91.2 kg)   LMP 07/10/1991  BMI 28.03 kg/m   Height: 5\' 11"  (180.3 cm)  General appearance: alert, cooperative and appears stated age Head: Normocephalic, without obvious abnormality, atraumatic Neck: no adenopathy, supple, symmetrical, trachea midline and thyroid normal to inspection and palpation Lungs: clear to auscultation bilaterally Breasts: normal appearance, no masses or tenderness Heart: regular rate and rhythm Abdomen: soft, non-tender; bowel sounds normal; no masses,  no organomegaly Extremities: extremities normal, atraumatic, no cyanosis or edema Skin: Skin color, texture, turgor normal. No rashes or lesions Lymph nodes: Cervical, supraclavicular, and axillary nodes normal. No abnormal inguinal nodes palpated Neurologic: Grossly normal   Pelvic: External genitalia:  no lesions              Urethra:   normal appearing urethra with no masses, tenderness or lesions              Bartholins and Skenes: normal                 Vagina: normal appearing vagina with normal color and discharge, no lesions              Cervix: absent              Pap taken: No. Bimanual Exam:  Uterus:  uterus absent              Adnexa: normal adnexa and no mass, fullness, tenderness               Rectovaginal: Confirms               Anus:  normal sphincter tone, no lesions  Chaperone, , CMA, was present for exam.  Assessment/Plan: 1. Well woman exam with routine gynecological exam - pap not indicated - MMG done 06/2020.  She will email me the results.  2. History of TIA (transient ischemic attack)  3. HSV-1 (herpes simplex virus 1) infection - does not need RF for Valtrex.  She will call if/when needs this  4. Menopausal symptoms - venlafaxine XR (EFFEXOR-XR) 75 MG 24 hr capsule; Take 1 capsule (75 mg total) by mouth every evening.  Dispense: 90 capsule; Refill: 4 - gabapentin (NEURONTIN) 300 MG capsule; TAKE 1 CAPSULE(300 MG) BY MOUTH AT BEDTIME  Dispense: 90 capsule; Refill: 4  5. Migraine without aura and without status migrainosus, not intractable  6. H/O abdominal hysterectomy  7.  Renal insufficiency   8.  Insomnia - Rx for Trazodone 100mg  nightly prn.  #90/4RF. - Risks of serotonin syndrome d/w pt

## 2020-08-12 ENCOUNTER — Ambulatory Visit: Payer: 59

## 2020-10-06 ENCOUNTER — Other Ambulatory Visit (HOSPITAL_BASED_OUTPATIENT_CLINIC_OR_DEPARTMENT_OTHER): Payer: Self-pay | Admitting: Obstetrics & Gynecology

## 2020-10-06 ENCOUNTER — Telehealth (HOSPITAL_BASED_OUTPATIENT_CLINIC_OR_DEPARTMENT_OTHER): Payer: Self-pay

## 2020-10-06 MED ORDER — PROMETHAZINE HCL 25 MG PO TABS: 25.0000 mg | ORAL_TABLET | Freq: Four times a day (QID) | ORAL | 1 refills | Status: DC | PRN

## 2020-10-06 NOTE — Telephone Encounter (Signed)
Pt request refill of phenergan 25mg  to be sent to [facility] in [facility]. Pt states she forgot to ask Dr. [facility] when she was here for her yearly exam. Please advise. Thanks.

## 2020-10-06 NOTE — Telephone Encounter (Signed)
Rx completed.  Thank you. 

## 2020-12-10 DIAGNOSIS — M5416 Radiculopathy, lumbar region: Secondary | ICD-10-CM | POA: Insufficient documentation

## 2021-05-11 DIAGNOSIS — I10 Essential (primary) hypertension: Secondary | ICD-10-CM | POA: Insufficient documentation

## 2021-08-01 ENCOUNTER — Encounter (HOSPITAL_BASED_OUTPATIENT_CLINIC_OR_DEPARTMENT_OTHER): Payer: Self-pay | Admitting: *Deleted

## 2021-08-23 ENCOUNTER — Other Ambulatory Visit (HOSPITAL_BASED_OUTPATIENT_CLINIC_OR_DEPARTMENT_OTHER): Payer: Self-pay | Admitting: *Deleted

## 2021-08-23 DIAGNOSIS — N951 Menopausal and female climacteric states: Secondary | ICD-10-CM

## 2021-08-23 MED ORDER — GABAPENTIN 300 MG PO CAPS: ORAL_CAPSULE | ORAL | 0 refills | Status: DC

## 2021-08-23 MED ORDER — VENLAFAXINE HCL ER 75 MG PO CP24: 75.0000 mg | ORAL_CAPSULE | Freq: Every evening | ORAL | 0 refills | Status: DC

## 2021-10-10 ENCOUNTER — Ambulatory Visit (INDEPENDENT_AMBULATORY_CARE_PROVIDER_SITE_OTHER): Payer: 59 | Admitting: Obstetrics & Gynecology

## 2021-10-10 ENCOUNTER — Encounter (HOSPITAL_BASED_OUTPATIENT_CLINIC_OR_DEPARTMENT_OTHER): Payer: Self-pay | Admitting: Obstetrics & Gynecology

## 2021-10-10 VITALS — BP 135/89 | HR 60 | Ht 70.0 in | Wt 196.0 lb

## 2021-10-10 DIAGNOSIS — B009 Herpesviral infection, unspecified: Secondary | ICD-10-CM

## 2021-10-10 DIAGNOSIS — Z01419 Encounter for gynecological examination (general) (routine) without abnormal findings: Secondary | ICD-10-CM

## 2021-10-10 DIAGNOSIS — F419 Anxiety disorder, unspecified: Secondary | ICD-10-CM

## 2021-10-10 DIAGNOSIS — Z8673 Personal history of transient ischemic attack (TIA), and cerebral infarction without residual deficits: Secondary | ICD-10-CM

## 2021-10-10 DIAGNOSIS — G4700 Insomnia, unspecified: Secondary | ICD-10-CM | POA: Diagnosis not present

## 2021-10-10 DIAGNOSIS — R110 Nausea: Secondary | ICD-10-CM | POA: Diagnosis not present

## 2021-10-10 DIAGNOSIS — N951 Menopausal and female climacteric states: Secondary | ICD-10-CM

## 2021-10-10 DIAGNOSIS — Z90710 Acquired absence of both cervix and uterus: Secondary | ICD-10-CM

## 2021-10-10 MED ORDER — VENLAFAXINE HCL ER 75 MG PO CP24: 75.0000 mg | ORAL_CAPSULE | Freq: Every evening | ORAL | 3 refills | Status: DC

## 2021-10-10 MED ORDER — ALPRAZOLAM 0.25 MG PO TABS: ORAL_TABLET | ORAL | 0 refills | Status: DC

## 2021-10-10 MED ORDER — PROMETHAZINE HCL 25 MG PO TABS: 25.0000 mg | ORAL_TABLET | Freq: Four times a day (QID) | ORAL | 1 refills | Status: DC | PRN

## 2021-10-10 MED ORDER — VALACYCLOVIR HCL 1 G PO TABS: 1000.0000 mg | ORAL_TABLET | ORAL | 1 refills | Status: DC | PRN

## 2021-10-10 MED ORDER — TRAZODONE HCL 100 MG PO TABS: 100.0000 mg | ORAL_TABLET | Freq: Every evening | ORAL | 4 refills | Status: DC | PRN

## 2021-10-10 MED ORDER — GABAPENTIN 300 MG PO CAPS: ORAL_CAPSULE | ORAL | 4 refills | Status: DC

## 2021-10-10 NOTE — Progress Notes (Signed)
61 y.o. G1P1 Married White or Caucasian female here for annual exam.  Doing well but husband had subarachnoid hemorrhage with multiple strokes.  He is doing so much better and he has no deficits.  He's back working now.  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   ? ?She's having increased anxiety right now as he has improved.  Options discussed.  Pt would like small dosage of xanax.   ? ?Denies vaginal bleeding. ? ?PCP is following her GFR carefully. ? ?Patient's last menstrual period was 07/10/1991.          ?Sexually active: Yes.    ?The current method of family planning is status post hysterectomy.    ?Smoker:  former smoker ? ?Health Maintenance: ?Pap:  not indicated ?History of abnormal Pap:  no ?MMG:  05/23/2019 negative ?Colonoscopy:  05/16/2012, follow up later this year ?BMD:   will plan the next few years ?Screening Labs: does with PCP, Dr. Iris Pert ? ? reports that she has quit smoking. Her smoking use included cigarettes. She smoked an average of .5 packs per day. She has never used smokeless tobacco. She reports current alcohol use. She reports that she does not use drugs. ? ?Past Medical History:  ?Diagnosis Date  ? Degeneration of lumbar intervertebral disc 07/24/2017  ? GERD (gastroesophageal reflux disease)   ? 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 Hyperhomocysteinemia (HCC)   ? Hypertension   ? Migraine   ? TIA (transient ischemic attack) 05/08/02  ? hyperhomocysteinemia  ? ? ?Past Surgical History:  ?Procedure Laterality Date  ? ABDOMINAL HYSTERECTOMY    ? fibroids  ? TONSILLECTOMY    ? tumor removed    ? benign, neck  ? ? ?Current Outpatient Medications  ?Medication Sig Dispense Refill  ? ALPRAZolam (XANAX) 0.25 MG tablet Take 1 tablet as needed for situational anxiety 30 tablet 0  ? HYDROcodone-acetaminophen (NORCO) 7.5-325 MG tablet TK 1 T PO QID PRN  0  ? lisinopril-hydrochlorothiazide (ZESTORETIC) 20-12.5 MG tablet Take 1 tablet by mouth daily.    ? methocarbamol (ROBAXIN) 500  MG tablet as needed.  0  ? Multiple Vitamins-Minerals (MULTIVITAMIN PO) Take by mouth daily.    ? pantoprazole (PROTONIX) 40 MG tablet daily.  2  ? gabapentin (NEURONTIN) 300 MG capsule TAKE 1 CAPSULE(300 MG) BY MOUTH AT BEDTIME 90 capsule 4  ? promethazine (PHENERGAN) 25 MG tablet Take 1 tablet (25 mg total) by mouth every 6 (six) hours as needed for nausea or vomiting. 30 tablet 1  ? traZODone (DESYREL) 100 MG tablet Take 1 tablet (100 mg total) by mouth at bedtime as needed for sleep. 90 tablet 4  ? valACYclovir (VALTREX) 1000 MG tablet Take 1 tablet (1,000 mg total) by mouth as needed. 30 tablet 1  ? venlafaxine XR (EFFEXOR-XR) 75 MG 24 hr capsule Take 1 capsule (75 mg total) by mouth every evening. 90 capsule 3  ? ?No current facility-administered medications for this visit.  ? ? ?Family History  ?Problem Relation Age of Onset  ? Diabetes Mother   ? Hypertension Mother   ? Other Mother   ?     Karlene Lineman  ? Diabetes Maternal Grandmother   ? Diabetes Maternal Grandfather   ? Hypertension Sister   ? Aneurysm Sister   ? Hypertension Father   ? Alcoholism Father   ? Hypertension Other   ?     all grandparents  ? Heart disease Paternal Grandmother   ?  Heart disease Paternal Grandfather   ? ? ?Review of Systems  ?All other systems reviewed and are negative. ? ?Exam:   ?BP 135/89 (BP Location: Right Arm, Patient Position: Sitting, Cuff Size: Large)   Pulse 60   Ht 5\' 10"  (1.778 m) Comment: Reported  Wt 196 lb (88.9 kg)   LMP 07/10/1991   BMI 28.12 kg/m?   Height: 5\' 10"  (177.8 cm) (Reported) ? ?General appearance: alert, cooperative and appears stated age ?Head: Normocephalic, without obvious abnormality, atraumatic ?Neck: no adenopathy, supple, symmetrical, trachea midline and thyroid normal to inspection and palpation ?Lungs: clear to auscultation bilaterally ?Breasts: normal appearance, no masses or tenderness ?Heart: regular rate and rhythm ?Abdomen: soft, non-tender; bowel sounds normal; no masses,  no  organomegaly ?Extremities: extremities normal, atraumatic, no cyanosis or edema ?Skin: Skin color, texture, turgor normal. No rashes or lesions ?Lymph nodes: Cervical, supraclavicular, and axillary nodes normal. ?No abnormal inguinal nodes palpated ?Neurologic: Grossly normal ? ? ?Pelvic: External genitalia:  no lesions ?             Urethra:  normal appearing urethra with no masses, tenderness or lesions ?             Bartholins and Skenes: normal    ?             Vagina: normal appearing vagina with normal color and no discharge, no lesions ?             Cervix: absent ?             Pap taken: No. ?Bimanual Exam:  Uterus:  uterus absent ?             Adnexa: no mass, fullness, tenderness ?              Rectovaginal: Confirms ?              Anus:  normal sphincter tone, no lesions ? ?Chaperone, Ina Homesonya , CMA, was present for exam. ? ?Assessment/Plan: ?1. Well woman exam with routine gynecological exam ?- Pap smear not indicated due to hx of hysterectomy ?- Mammogram 05/2019 ?- Colonoscopy 05/2012.  Pt aware due later this year.   ?- Bone mineral density will be planned by at 65 ?- lab work done done with PCP ?- vaccines reviewed/updated ? ?2. HSV-1 (herpes simplex virus 1) infection ?- valACYclovir (VALTREX) 1000 MG tablet; Take 1 tablet (1,000 mg total) by mouth as needed.  Dispense: 30 tablet; Refill: 1 ? ?3. Menopausal symptoms ?- venlafaxine XR (EFFEXOR-XR) 75 MG 24 hr capsule; Take 1 capsule (75 mg total) by mouth every evening.  Dispense: 90 capsule; Refill: 3 ?- gabapentin (NEURONTIN) 300 MG capsule; TAKE 1 CAPSULE(300 MG) BY MOUTH AT BEDTIME  Dispense: 90 capsule; Refill: 4 ? ?4. History of TIA (transient ischemic attack) ? ?5. Anxiety ?- feel pt may have some PTSD due to medical issues with husband.  Counseling discussed.  She does have some access to resources via work.   ?- will prescribe low dosed xanax for prn use.  Pt and I discussed dependence and tolerance with this. ?- ALPRAZolam (XANAX) 0.25  MG tablet; Take 1 tablet as needed for situational anxiety  Dispense: 30 tablet; Refill: 0 ? ?6. Insomnia, unspecified type ?- traZODone (DESYREL) 100 MG tablet; Take 1 tablet (100 mg total) by mouth at bedtime as needed for sleep.  Dispense: 90 tablet; Refill: 4 ? ?7. Nausea ?- promethazine (PHENERGAN) 25 MG tablet; Take 1 tablet (25 mg  total) by mouth every 6 (six) hours as needed for nausea or vomiting.  Dispense: 30 tablet; Refill: 1 ? ?8. H/O: hysterectomy ? ? ?

## 2021-10-17 ENCOUNTER — Encounter (HOSPITAL_BASED_OUTPATIENT_CLINIC_OR_DEPARTMENT_OTHER): Payer: Self-pay | Admitting: *Deleted

## 2022-05-02 ENCOUNTER — Emergency Department (HOSPITAL_BASED_OUTPATIENT_CLINIC_OR_DEPARTMENT_OTHER): Payer: 59

## 2022-05-02 ENCOUNTER — Emergency Department (HOSPITAL_BASED_OUTPATIENT_CLINIC_OR_DEPARTMENT_OTHER)
Admission: EM | Admit: 2022-05-02 | Discharge: 2022-05-02 | Disposition: A | Discharge status: Home / Self Care | Payer: 59 | Attending: Emergency Medicine | Admitting: Emergency Medicine

## 2022-05-02 ENCOUNTER — Encounter (HOSPITAL_BASED_OUTPATIENT_CLINIC_OR_DEPARTMENT_OTHER): Payer: Self-pay | Admitting: Urology

## 2022-05-02 ENCOUNTER — Other Ambulatory Visit: Payer: Self-pay

## 2022-05-02 DIAGNOSIS — R519 Headache, unspecified: Secondary | ICD-10-CM | POA: Insufficient documentation

## 2022-05-02 DIAGNOSIS — Y92410 Unspecified street and highway as the place of occurrence of the external cause: Secondary | ICD-10-CM | POA: Insufficient documentation

## 2022-05-02 DIAGNOSIS — M542 Cervicalgia: Secondary | ICD-10-CM | POA: Diagnosis not present

## 2022-05-02 DIAGNOSIS — Z79899 Other long term (current) drug therapy: Secondary | ICD-10-CM | POA: Diagnosis not present
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
## 2022-05-02 NOTE — Discharge Instructions (Signed)
It was pleasure taking care of you today!   Your CT scans didn't show any concerning findings at this time. You may call your orthopedist to set up a follow up appointment regarding todays ED visit. Continue taking your prescribed medications as prescribed. Return to the ED if you are experiencing increasing/worsening symptoms.
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
## 2022-07-20 DIAGNOSIS — N1831 Chronic kidney disease, stage 3a: Secondary | ICD-10-CM | POA: Insufficient documentation

## 2022-07-20 DIAGNOSIS — R7303 Prediabetes: Secondary | ICD-10-CM | POA: Insufficient documentation

## 2022-07-20 DIAGNOSIS — E782 Mixed hyperlipidemia: Secondary | ICD-10-CM | POA: Insufficient documentation

## 2022-10-18 ENCOUNTER — Ambulatory Visit (HOSPITAL_BASED_OUTPATIENT_CLINIC_OR_DEPARTMENT_OTHER): Payer: Managed Care, Other (non HMO) | Admitting: Obstetrics & Gynecology

## 2022-10-18 ENCOUNTER — Encounter (HOSPITAL_BASED_OUTPATIENT_CLINIC_OR_DEPARTMENT_OTHER): Payer: Self-pay | Admitting: Obstetrics & Gynecology

## 2022-10-18 VITALS — BP 103/77 | HR 79 | Ht 70.0 in | Wt 197.0 lb

## 2022-10-18 DIAGNOSIS — B009 Herpesviral infection, unspecified: Secondary | ICD-10-CM

## 2022-10-18 DIAGNOSIS — N951 Menopausal and female climacteric states: Secondary | ICD-10-CM | POA: Diagnosis not present

## 2022-10-18 DIAGNOSIS — R110 Nausea: Secondary | ICD-10-CM

## 2022-10-18 DIAGNOSIS — G4700 Insomnia, unspecified: Secondary | ICD-10-CM

## 2022-10-18 DIAGNOSIS — Z13820 Encounter for screening for osteoporosis: Secondary | ICD-10-CM | POA: Diagnosis not present

## 2022-10-18 DIAGNOSIS — Z01419 Encounter for gynecological examination (general) (routine) without abnormal findings: Secondary | ICD-10-CM | POA: Diagnosis not present

## 2022-10-18 DIAGNOSIS — N1831 Chronic kidney disease, stage 3a: Secondary | ICD-10-CM

## 2022-10-18 DIAGNOSIS — Z8673 Personal history of transient ischemic attack (TIA), and cerebral infarction without residual deficits: Secondary | ICD-10-CM

## 2022-10-18 MED ORDER — GABAPENTIN 300 MG PO CAPS: ORAL_CAPSULE | ORAL | 4 refills | Status: DC

## 2022-10-18 MED ORDER — PROMETHAZINE HCL 25 MG PO TABS
25.0000 mg | ORAL_TABLET | Freq: Four times a day (QID) | ORAL | 1 refills | Status: DC | PRN
Start: 2022-10-18 — End: 2024-05-06

## 2022-10-18 MED ORDER — VENLAFAXINE HCL ER 75 MG PO CP24: 75.0000 mg | ORAL_CAPSULE | Freq: Every evening | ORAL | 3 refills | Status: DC

## 2022-10-18 MED ORDER — VALACYCLOVIR HCL 1 G PO TABS: 1000.0000 mg | ORAL_TABLET | ORAL | 1 refills | Status: DC | PRN

## 2022-10-18 NOTE — Progress Notes (Signed)
62 y.o. G1P1 Married White or Caucasian female here for annual exam.  Doing well.  Husband had subarachnoid hemorrhage with multiple strokes.  He is going so well.  Fully back at work.  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  Will close at the end of the year.    Denies vaginal bleeding.    H/o prediabetes.  Most recent hba1c was 5.8 on 07/17/2022.  Creatinine 1.15.  GRF 54.  Did have CT scan for h/o smoking.  Has a 77mm right upper lobe nodule.  Stable with all medications.  Needs RF.  Doesn't need trazodone.  Sleeping has significantly improved with job change.  Patient's last menstrual period was 07/10/1991.          Sexually active: Yes.    The current method of family planning is status post hysterectomy.    Exercising: Yes.     Walking most evening. Smoker:  no, former  Health Maintenance: Pap: not indicated History of abnormal Pap:  no MMG:  07/31/2021 Additional images recommended.  Follow up normal.  Hasn't had MMG yet this year. Colonoscopy:  05/16/2012.  Follow up due.  Is going to do cologuard this year BMD:   order placed to do with MMG this year Screening Labs: done 07/2022.  Reviewed in EPIC.   reports that she has quit smoking. Her smoking use included cigarettes. She smoked an average of .5 packs per day. She has never used smokeless tobacco. She reports current alcohol use. She reports that she does not use drugs.  Past Medical History:  Diagnosis Date   Degeneration of lumbar intervertebral disc 07/24/2017   GERD (gastroesophageal reflux disease)    HSV-1 infection    genital   Hyperhomocysteinemia    Hypertension    Migraine    TIA (transient ischemic attack) 05/08/02   hyperhomocysteinemia    Past Surgical History:  Procedure Laterality Date   ABDOMINAL HYSTERECTOMY     fibroids   TONSILLECTOMY     tumor removed     benign, neck    Current Outpatient Medications  Medication Sig Dispense Refill   ALPRAZolam (XANAX) 0.25 MG tablet Take 1 tablet as needed for  situational anxiety 30 tablet 0   gabapentin (NEURONTIN) 300 MG capsule TAKE 1 CAPSULE(300 MG) BY MOUTH AT BEDTIME 90 capsule 4   HYDROcodone-acetaminophen (NORCO) 7.5-325 MG tablet TK 1 T PO QID PRN  0   lisinopril-hydrochlorothiazide (ZESTORETIC) 20-12.5 MG tablet Take 1 tablet by mouth daily.     methocarbamol (ROBAXIN) 500 MG tablet as needed.  0   Multiple Vitamins-Minerals (MULTIVITAMIN PO) Take by mouth daily.     pantoprazole (PROTONIX) 40 MG tablet daily.  2   promethazine (PHENERGAN) 25 MG tablet Take 1 tablet (25 mg total) by mouth every 6 (six) hours as needed for nausea or vomiting. 30 tablet 1   traZODone (DESYREL) 100 MG tablet Take 1 tablet (100 mg total) by mouth at bedtime as needed for sleep. 90 tablet 4   valACYclovir (VALTREX) 1000 MG tablet Take 1 tablet (1,000 mg total) by mouth as needed. 30 tablet 1   venlafaxine XR (EFFEXOR-XR) 75 MG 24 hr capsule Take 1 capsule (75 mg total) by mouth every evening. 90 capsule 3   No current facility-administered medications for this visit.    Family History  Problem Relation Age of Onset   Diabetes Mother    Hypertension Mother    Other Mother        Karlene Lineman  Diabetes Maternal Grandmother    Diabetes Maternal Grandfather    Hypertension Sister    Aneurysm Sister    Hypertension Father    Alcoholism Father    Hypertension Other        all grandparents   Heart disease Paternal Grandmother    Heart disease Paternal Grandfather     ROS: Constitutional: negative Genitourinary:negative  Exam:   BP 103/77 (BP Location: Left Arm, Patient Position: Sitting, Cuff Size: Large)   Pulse 79   Ht 5\' 10"  (1.778 m) Comment: Reported  Wt 197 lb (89.4 kg)   LMP 07/10/1991   BMI 28.27 kg/m   Height: 5\' 10"  (177.8 cm) (Reported)  General appearance: alert, cooperative and appears stated age Head: Normocephalic, without obvious abnormality, atraumatic Neck: no adenopathy, supple, symmetrical, trachea midline and thyroid  normal to inspection and palpation Lungs: clear to auscultation bilaterally Breasts: normal appearance, no masses or tenderness, bilateral implants present Heart: regular rate and rhythm Abdomen: soft, non-tender; bowel sounds normal; no masses,  no organomegaly Extremities: extremities normal, atraumatic, no cyanosis or edema Skin: Skin color, texture, turgor normal. No rashes or lesions Lymph nodes: Cervical, supraclavicular, and axillary nodes normal. No abnormal inguinal nodes palpated Neurologic: Grossly normal   Pelvic: External genitalia:  no lesions              Urethra:  normal appearing urethra with no masses, tenderness or lesions              Bartholins and Skenes: normal                 Vagina: normal appearing vagina with normal color and no discharge, no lesions              Cervix: absent              Pap taken: No. Bimanual Exam:  Uterus:  uterus absent              Adnexa: no mass, fullness, tenderness               Rectovaginal: Confirms               Anus:  normal sphincter tone, no lesions  Chaperone, Ina Homes, CMA, was present for exam.  Assessment/Plan: 1. Well woman exam with routine gynecological exam - Pap smear not indicated - Mammogram is due.  Pt aware. - Colonoscopy 2013.  She is going to do Cologuard this year. - Bone mineral density oRdered - lab work done with PCP, Dr. Iris Pert - vaccines reviewed/updated.  Has not done shingrix yet.  Planning this year.   2. Menopausal symptoms - venlafaxine XR (EFFEXOR-XR) 75 MG 24 hr capsule; Take 1 capsule (75 mg total) by mouth every evening.  Dispense: 90 capsule; Refill: 3 - gabapentin (NEURONTIN) 300 MG capsule; TAKE 1 CAPSULE(300 MG) BY MOUTH AT BEDTIME  Dispense: 90 capsule; Refill: 4  3. Osteoporosis screening - DG BONE DENSITY (DXA); Future  4. Insomnia, unspecified type - much improved  5. HSV-1 (herpes simplex virus 1) infection - valACYclovir (VALTREX) 1000 MG tablet; Take 1 tablet (1,000 mg  total) by mouth as needed.  Dispense: 30 tablet; Refill: 1  6. Nausea - promethazine (PHENERGAN) 25 MG tablet; Take 1 tablet (25 mg total) by mouth every 6 (six) hours as needed for nausea or vomiting.  Dispense: 30 tablet; Refill: 1  7. Stage 3a chronic kidney disease  8. History of TIA (transient ischemic attack)

## 2023-10-18 ENCOUNTER — Other Ambulatory Visit (HOSPITAL_BASED_OUTPATIENT_CLINIC_OR_DEPARTMENT_OTHER): Payer: Self-pay | Admitting: Obstetrics & Gynecology

## 2023-10-18 DIAGNOSIS — N951 Menopausal and female climacteric states: Secondary | ICD-10-CM

## 2023-10-21 ENCOUNTER — Ambulatory Visit (HOSPITAL_BASED_OUTPATIENT_CLINIC_OR_DEPARTMENT_OTHER): Payer: Managed Care, Other (non HMO) | Admitting: Obstetrics & Gynecology

## 2023-12-04 ENCOUNTER — Other Ambulatory Visit (HOSPITAL_BASED_OUTPATIENT_CLINIC_OR_DEPARTMENT_OTHER): Payer: Self-pay | Admitting: Obstetrics & Gynecology

## 2023-12-04 DIAGNOSIS — B009 Herpesviral infection, unspecified: Secondary | ICD-10-CM

## 2023-12-09 ENCOUNTER — Ambulatory Visit (HOSPITAL_BASED_OUTPATIENT_CLINIC_OR_DEPARTMENT_OTHER): Payer: Managed Care, Other (non HMO) | Admitting: Obstetrics & Gynecology

## 2023-12-19 ENCOUNTER — Encounter (HOSPITAL_BASED_OUTPATIENT_CLINIC_OR_DEPARTMENT_OTHER): Payer: Self-pay | Admitting: Obstetrics & Gynecology

## 2024-03-19 ENCOUNTER — Ambulatory Visit (HOSPITAL_BASED_OUTPATIENT_CLINIC_OR_DEPARTMENT_OTHER): Admitting: Obstetrics & Gynecology

## 2024-05-05 ENCOUNTER — Other Ambulatory Visit: Payer: Self-pay | Admitting: Medical Genetics

## 2024-05-06 ENCOUNTER — Encounter (HOSPITAL_BASED_OUTPATIENT_CLINIC_OR_DEPARTMENT_OTHER): Payer: Self-pay | Admitting: Obstetrics & Gynecology

## 2024-05-06 ENCOUNTER — Ambulatory Visit (HOSPITAL_BASED_OUTPATIENT_CLINIC_OR_DEPARTMENT_OTHER): Admitting: Obstetrics & Gynecology

## 2024-05-06 VITALS — BP 125/86 | HR 74 | Ht 71.0 in | Wt 198.6 lb

## 2024-05-06 DIAGNOSIS — F419 Anxiety disorder, unspecified: Secondary | ICD-10-CM

## 2024-05-06 DIAGNOSIS — Z01419 Encounter for gynecological examination (general) (routine) without abnormal findings: Secondary | ICD-10-CM

## 2024-05-06 DIAGNOSIS — Z1231 Encounter for screening mammogram for malignant neoplasm of breast: Secondary | ICD-10-CM

## 2024-05-06 DIAGNOSIS — Z90710 Acquired absence of both cervix and uterus: Secondary | ICD-10-CM | POA: Insufficient documentation

## 2024-05-06 DIAGNOSIS — Z1211 Encounter for screening for malignant neoplasm of colon: Secondary | ICD-10-CM

## 2024-05-06 DIAGNOSIS — R110 Nausea: Secondary | ICD-10-CM

## 2024-05-06 DIAGNOSIS — B009 Herpesviral infection, unspecified: Secondary | ICD-10-CM

## 2024-05-06 DIAGNOSIS — Z8673 Personal history of transient ischemic attack (TIA), and cerebral infarction without residual deficits: Secondary | ICD-10-CM

## 2024-05-06 DIAGNOSIS — N951 Menopausal and female climacteric states: Secondary | ICD-10-CM

## 2024-05-06 DIAGNOSIS — E2839 Other primary ovarian failure: Secondary | ICD-10-CM

## 2024-05-06 MED ORDER — VALACYCLOVIR HCL 1 G PO TABS: 1000.0000 mg | ORAL_TABLET | Freq: Every day | ORAL | Status: AC

## 2024-05-06 MED ORDER — VENLAFAXINE HCL ER 37.5 MG PO CP24: 112.5000 mg | ORAL_CAPSULE | Freq: Every day | ORAL | 3 refills | Status: AC

## 2024-05-06 MED ORDER — PROMETHAZINE HCL 25 MG PO TABS: 25.0000 mg | ORAL_TABLET | Freq: Four times a day (QID) | ORAL | 1 refills | Status: AC | PRN

## 2024-05-06 MED ORDER — ALPRAZOLAM 0.25 MG PO TABS: ORAL_TABLET | ORAL | 0 refills | Status: AC

## 2024-05-06 NOTE — Progress Notes (Signed)
 ANNUAL EXAM Patient name: Autumn Coleman MRN [id number]  Date of birth: [birth year] Chief Complaint:   Here for annual exam  History of Present Illness:   Autumn Coleman is a 63 y.o. G1P1 Caucasian female being seen today for a routine annual exam.  Had a lot of stressors with son in law committed suicide in front of her daughter.  He left her in a lot of debt.    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  He has a small amount of weakness in left hand but no other issues.  He really is doing well.    Denies vaginal bleeding.   Patient's last menstrual period was 07/10/1991.   Last pap: Hysterectomy. H/O abnormal pap: no Last mammogram: 12/31/2023 with [facility]. Results were: normal. Family h/o breast cancer: no Last colonoscopy: 05/16/2012. Results were: normal. Family h/o colorectal cancer: no.  Cologuard ordered last year.  Will send updated order.     05/06/2024    8:33 AM 10/18/2022    8:21 AM 10/10/2021   10:07 AM  Depression screen PHQ 2/9  Decreased Interest 1 0 0  Down, Depressed, Hopeless 1 0 0  PHQ - 2 Score 2 0 0  Altered sleeping 1    Tired, decreased energy 1    Change in appetite 0    Feeling bad or failure about yourself  0    Trouble concentrating 0    Moving slowly or fidgety/restless 0    Suicidal thoughts 0    PHQ-9 Score 4       Review of Systems:   Pertinent items are noted in HPI Denies any urinary or bowel changes.  Denies pelvic pain.   Pertinent History Reviewed:  Reviewed past medical,surgical, social and family history.  Reviewed problem list, medications and allergies. Physical Assessment:   Vitals:   05/06/24 0825  BP: 125/86  Pulse: 74  SpO2: 100%  Weight: 198 lb 9.6 oz (90.1 kg)  Height: 5' 11 (1.803 m)  Body mass index is 27.7 kg/m.        Physical Examination:   General appearance - well appearing, and in no distress  Mental status - alert, oriented to person, place, and time  Psych:  She has a normal mood and  affect  Skin - warm and dry, normal color, no suspicious lesions noted  Chest - effort normal, all lung fields clear to auscultation bilaterally  Heart - normal rate and regular rhythm  Neck:  midline trachea, no thyromegaly or nodules  Breasts - breasts appear normal, no suspicious masses, no skin or nipple changes or  axillary nodes  Abdomen - soft, nontender, nondistended, no masses or organomegaly  Pelvic - VULVA: normal appearing vulva with no masses, tenderness or lesions   VAGINA: normal appearing vagina with normal color and discharge, no lesions   CERVIX: surgically absent  Thin prep pap is not indicated  UTERUS: surgically absent  ADNEXA: No adnexal masses or tenderness noted.  Rectal - normal rectal, good sphincter tone, no masses felt  Extremities:  No swelling or varicosities noted  Chaperone present for exam  No results found for this or any previous visit (from the past 24 hours).  Assessment & Plan:  1. Well woman exam with routine gynecological exam (Primary) - Pap smear not indicated - Mammogram due.  Order placed.   - Colonoscopy due.  Would like to do cologuard this year.   - Bone mineral density ordered - lab  work will be done with PCP at next appt - vaccines reviewed/updated  2. History of TIA (transient ischemic attack)  3. Menopausal symptoms  4. H/O: hysterectomy  5. Hypoestrogenism - DG Bone Density; Future  6. Encounter for screening mammogram for malignant neoplasm of breast - MM 3D SCREENING MAMMOGRAM BILATERAL BREAST; Future  7. Colon cancer screening - Cologuard  8. Anxiety - will increase Effexor  to 112.5mg  due to recent life stressors - venlafaxine  XR (EFFEXOR -XR) 37.5 MG 24 hr capsule; Take 3 capsules (112.5 mg total) by mouth daily with breakfast.  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  Dispense: 30 tablet; Refill: 01  10. Nausea - promethazine  (PHENERGAN ) 25 MG tablet; Take 1 tablet (25 mg total) by mouth every 6 (six) hours as needed for nausea or vomiting.  Dispense: 30 tablet; Refill: 1   Orders Placed This Encounter  Procedures   DG Bone Density   MM 3D SCREENING MAMMOGRAM BILATERAL BREAST   Cologuard    Meds:  Meds ordered this encounter  Medications   venlafaxine  XR (EFFEXOR -XR) 37.5 MG 24 hr capsule    Sig: Take 3 capsules (112.5 mg total) by mouth daily with breakfast.    Dispense:  270 capsule    Refill:  3   ALPRAZolam  (XANAX ) 0.25 MG tablet    Sig: Take 1 tablet as needed for situational anxiety    Dispense:  30 tablet    Refill:  0   valACYclovir  (VALTREX ) 1000 MG tablet    Sig: Take 1 tablet (1,000 mg total) by mouth daily.    Dispense:  30 tablet    Refill:  01   promethazine  (PHENERGAN ) 25 MG tablet    Sig: Take 1 tablet (25 mg total) by mouth every 6 (six) hours as needed for nausea or vomiting.    Dispense:  30 tablet    Refill:  1    Follow-up: Return in about 1 year (around 05/06/2025).  Ronal GORMAN Pinal, MD 05/06/2024 9:19 AM
